# Patient Record
Sex: Female | Born: 1965 | Race: White | Hispanic: No | Marital: Married | Smoking: Never smoker
Health system: Southern US, Community
[De-identification: ages and names within clinical notes are randomized; demographics above are authoritative.]

## PROBLEM LIST (undated history)

## (undated) DIAGNOSIS — T8859XA Other complications of anesthesia, initial encounter: Secondary | ICD-10-CM

## (undated) DIAGNOSIS — K219 Gastro-esophageal reflux disease without esophagitis: Secondary | ICD-10-CM

## (undated) DIAGNOSIS — T7840XA Allergy, unspecified, initial encounter: Secondary | ICD-10-CM

## (undated) DIAGNOSIS — R011 Cardiac murmur, unspecified: Secondary | ICD-10-CM

## (undated) DIAGNOSIS — R7303 Prediabetes: Secondary | ICD-10-CM

## (undated) HISTORY — PX: TONSILLECTOMY: SUR1361

## (undated) HISTORY — DX: Cardiac murmur, unspecified: R01.1

## (undated) HISTORY — DX: Allergy, unspecified, initial encounter: T78.40XA

## (undated) HISTORY — DX: Prediabetes: R73.03

## (undated) HISTORY — DX: Gastro-esophageal reflux disease without esophagitis: K21.9

---

## 2012-12-02 HISTORY — PX: LEEP: SHX91

## 2015-03-22 ENCOUNTER — Other Ambulatory Visit (HOSPITAL_COMMUNITY)
Admission: RE | Admit: 2015-03-22 | Discharge: 2015-03-22 | Disposition: A | Discharge status: Home / Self Care | Payer: 59 | Source: Ambulatory Visit | Attending: Family Medicine | Admitting: Family Medicine

## 2015-03-22 DIAGNOSIS — Z124 Encounter for screening for malignant neoplasm of cervix: Secondary | ICD-10-CM | POA: Insufficient documentation

## 2015-08-22 ENCOUNTER — Other Ambulatory Visit (HOSPITAL_COMMUNITY): Payer: Self-pay | Admitting: Obstetrics & Gynecology

## 2015-08-22 DIAGNOSIS — Z1231 Encounter for screening mammogram for malignant neoplasm of breast: Secondary | ICD-10-CM

## 2015-08-24 ENCOUNTER — Ambulatory Visit (HOSPITAL_COMMUNITY)
Admission: RE | Admit: 2015-08-24 | Discharge: 2015-08-24 | Disposition: A | Discharge status: Home / Self Care | Payer: 59 | Source: Ambulatory Visit | Attending: Obstetrics & Gynecology | Admitting: Obstetrics & Gynecology

## 2015-08-24 DIAGNOSIS — Z1231 Encounter for screening mammogram for malignant neoplasm of breast: Secondary | ICD-10-CM | POA: Diagnosis not present

## 2016-05-30 ENCOUNTER — Other Ambulatory Visit (HOSPITAL_COMMUNITY)
Admission: RE | Admit: 2016-05-30 | Discharge: 2016-05-30 | Disposition: A | Discharge status: Home / Self Care | Payer: 59 | Source: Ambulatory Visit | Attending: Obstetrics & Gynecology | Admitting: Obstetrics & Gynecology

## 2016-05-30 ENCOUNTER — Other Ambulatory Visit: Payer: Self-pay | Admitting: Obstetrics & Gynecology

## 2016-05-30 DIAGNOSIS — Z1151 Encounter for screening for human papillomavirus (HPV): Secondary | ICD-10-CM | POA: Insufficient documentation

## 2016-05-30 DIAGNOSIS — Z01411 Encounter for gynecological examination (general) (routine) with abnormal findings: Secondary | ICD-10-CM | POA: Insufficient documentation

## 2016-06-03 LAB — CYTOLOGY - PAP

## 2017-02-18 ENCOUNTER — Other Ambulatory Visit: Payer: Self-pay | Admitting: Obstetrics & Gynecology

## 2017-02-18 DIAGNOSIS — Z1231 Encounter for screening mammogram for malignant neoplasm of breast: Secondary | ICD-10-CM

## 2017-03-10 ENCOUNTER — Ambulatory Visit: Payer: 59

## 2017-03-17 ENCOUNTER — Ambulatory Visit
Admission: RE | Admit: 2017-03-17 | Discharge: 2017-03-17 | Disposition: A | Discharge status: Home / Self Care | Payer: 59 | Source: Ambulatory Visit | Attending: Obstetrics & Gynecology | Admitting: Obstetrics & Gynecology

## 2017-03-17 DIAGNOSIS — Z1231 Encounter for screening mammogram for malignant neoplasm of breast: Secondary | ICD-10-CM

## 2017-07-02 ENCOUNTER — Other Ambulatory Visit: Payer: Self-pay | Admitting: Obstetrics & Gynecology

## 2018-01-09 HISTORY — PX: COLONOSCOPY: SHX174

## 2018-05-28 ENCOUNTER — Other Ambulatory Visit: Payer: Self-pay | Admitting: Obstetrics & Gynecology

## 2018-05-28 DIAGNOSIS — Z1231 Encounter for screening mammogram for malignant neoplasm of breast: Secondary | ICD-10-CM

## 2018-07-08 ENCOUNTER — Ambulatory Visit
Admission: RE | Admit: 2018-07-08 | Discharge: 2018-07-08 | Disposition: A | Discharge status: Home / Self Care | Payer: 59 | Source: Ambulatory Visit | Attending: Obstetrics & Gynecology | Admitting: Obstetrics & Gynecology

## 2018-07-08 ENCOUNTER — Other Ambulatory Visit: Payer: Self-pay | Admitting: Obstetrics & Gynecology

## 2018-07-08 DIAGNOSIS — Z1231 Encounter for screening mammogram for malignant neoplasm of breast: Secondary | ICD-10-CM

## 2019-08-17 ENCOUNTER — Other Ambulatory Visit: Payer: Self-pay | Admitting: Obstetrics & Gynecology

## 2019-08-17 DIAGNOSIS — Z1231 Encounter for screening mammogram for malignant neoplasm of breast: Secondary | ICD-10-CM

## 2019-10-06 ENCOUNTER — Ambulatory Visit
Admission: RE | Admit: 2019-10-06 | Discharge: 2019-10-06 | Disposition: A | Discharge status: Home / Self Care | Payer: 59 | Source: Ambulatory Visit | Attending: Obstetrics & Gynecology | Admitting: Obstetrics & Gynecology

## 2019-10-06 ENCOUNTER — Other Ambulatory Visit: Payer: Self-pay

## 2019-10-06 DIAGNOSIS — Z1231 Encounter for screening mammogram for malignant neoplasm of breast: Secondary | ICD-10-CM

## 2020-08-28 ENCOUNTER — Other Ambulatory Visit: Payer: Self-pay | Admitting: Obstetrics & Gynecology

## 2020-08-28 DIAGNOSIS — Z1231 Encounter for screening mammogram for malignant neoplasm of breast: Secondary | ICD-10-CM

## 2020-10-12 ENCOUNTER — Ambulatory Visit
Admission: RE | Admit: 2020-10-12 | Discharge: 2020-10-12 | Disposition: A | Discharge status: Home / Self Care | Payer: 59 | Source: Ambulatory Visit | Attending: Obstetrics & Gynecology | Admitting: Obstetrics & Gynecology

## 2020-10-12 ENCOUNTER — Other Ambulatory Visit: Payer: Self-pay

## 2020-10-12 DIAGNOSIS — Z1231 Encounter for screening mammogram for malignant neoplasm of breast: Secondary | ICD-10-CM

## 2020-10-17 ENCOUNTER — Other Ambulatory Visit: Payer: Self-pay | Admitting: Obstetrics & Gynecology

## 2020-10-17 DIAGNOSIS — R928 Other abnormal and inconclusive findings on diagnostic imaging of breast: Secondary | ICD-10-CM

## 2020-10-30 ENCOUNTER — Other Ambulatory Visit: Payer: 59

## 2020-11-08 ENCOUNTER — Other Ambulatory Visit: Payer: 59

## 2020-11-30 ENCOUNTER — Ambulatory Visit
Admission: RE | Admit: 2020-11-30 | Discharge: 2020-11-30 | Disposition: A | Discharge status: Home / Self Care | Payer: 59 | Source: Ambulatory Visit | Attending: Obstetrics & Gynecology | Admitting: Obstetrics & Gynecology

## 2020-11-30 ENCOUNTER — Other Ambulatory Visit: Payer: Self-pay

## 2020-11-30 ENCOUNTER — Ambulatory Visit: Payer: 59

## 2020-11-30 DIAGNOSIS — R928 Other abnormal and inconclusive findings on diagnostic imaging of breast: Secondary | ICD-10-CM

## 2021-04-04 ENCOUNTER — Other Ambulatory Visit (HOSPITAL_COMMUNITY): Payer: Self-pay | Admitting: Family Medicine

## 2021-04-16 ENCOUNTER — Other Ambulatory Visit (HOSPITAL_COMMUNITY): Payer: Self-pay | Admitting: Family Medicine

## 2021-11-20 ENCOUNTER — Other Ambulatory Visit: Payer: Self-pay | Admitting: Obstetrics and Gynecology

## 2021-11-20 DIAGNOSIS — Z1231 Encounter for screening mammogram for malignant neoplasm of breast: Secondary | ICD-10-CM

## 2021-12-20 ENCOUNTER — Ambulatory Visit
Admission: RE | Admit: 2021-12-20 | Discharge: 2021-12-20 | Disposition: A | Discharge status: Home / Self Care | Payer: 59 | Source: Ambulatory Visit | Attending: Obstetrics and Gynecology | Admitting: Obstetrics and Gynecology

## 2021-12-20 DIAGNOSIS — Z1231 Encounter for screening mammogram for malignant neoplasm of breast: Secondary | ICD-10-CM

## 2022-03-14 ENCOUNTER — Other Ambulatory Visit (HOSPITAL_COMMUNITY): Payer: Self-pay | Admitting: Family Medicine

## 2022-03-14 DIAGNOSIS — E785 Hyperlipidemia, unspecified: Secondary | ICD-10-CM

## 2022-08-23 ENCOUNTER — Other Ambulatory Visit (HOSPITAL_COMMUNITY)
Admission: RE | Admit: 2022-08-23 | Discharge: 2022-08-23 | Disposition: A | Discharge status: Home / Self Care | Payer: 59 | Source: Ambulatory Visit | Attending: Obstetrics and Gynecology | Admitting: Obstetrics and Gynecology

## 2022-08-23 ENCOUNTER — Other Ambulatory Visit: Payer: Self-pay | Admitting: Obstetrics and Gynecology

## 2022-08-23 DIAGNOSIS — Z01419 Encounter for gynecological examination (general) (routine) without abnormal findings: Secondary | ICD-10-CM | POA: Insufficient documentation

## 2022-08-28 LAB — CYTOLOGY - PAP
Adequacy: ABSENT
Comment: NEGATIVE
Comment: NEGATIVE
Comment: NEGATIVE
HPV 16: NEGATIVE
HPV 18 / 45: NEGATIVE
High risk HPV: POSITIVE — AB

## 2022-12-16 ENCOUNTER — Encounter: Payer: Self-pay | Admitting: Family Medicine

## 2022-12-16 ENCOUNTER — Ambulatory Visit: Payer: 59 | Admitting: Family Medicine

## 2022-12-16 VITALS — BP 130/84 | HR 81 | Temp 98.0°F | Ht 69.0 in | Wt 191.5 lb

## 2022-12-16 DIAGNOSIS — Z0000 Encounter for general adult medical examination without abnormal findings: Secondary | ICD-10-CM

## 2022-12-16 DIAGNOSIS — Z1159 Encounter for screening for other viral diseases: Secondary | ICD-10-CM | POA: Diagnosis not present

## 2022-12-16 NOTE — Patient Instructions (Addendum)
Welcome to [employer]! It was a pleasure meeting you today.  As discussed, Please schedule a 3 month follow up visit today.  Consider shingrix  I couldn't find dates in Tdap in [place] gyn notes nor [facility].    PLEASE NOTE:  If you had any LAB tests please let us know if you have not heard back within a few days. You may see your results on MyChart before we have a chance to review them but we will give you a call once they are reviewed by [place]. If we ordered any REFERRALS today, please let us know if you have not heard from their office within the next week.  Let us know through MyChart if you are needing REFILLS, or have your pharmacy send [place] the request. You can also use MyChart to communicate with me or any office staff.  Please try these tips to maintain a healthy lifestyle:  Eat most of your calories during the day when you are active. Eliminate processed foods including packaged sweets (pies, cakes, cookies), reduce intake of potatoes, white bread, white pasta, and white rice. Look for whole grain options, oat flour or almond flour.  Each meal should contain half fruits/vegetables, one quarter protein, and one quarter carbs (no bigger than a computer mouse).  Cut down on sweet beverages. This includes juice, soda, and sweet tea. Also watch fruit intake, though this is a healthier sweet option, it still contains natural sugar! Limit to 3 servings daily.  Drink at least 1 glass of water with each meal and aim for at least 8 glasses per day  Exercise at least 150 minutes every week.

## 2022-12-16 NOTE — Progress Notes (Signed)
Phone [phone]   Subjective:   Patient is a 57 y.o. female presenting for annual physical.    Chief Complaint  Patient presents with   Establish Care    Need new pcp CPE  New pt-annual   Elevated cholesterol. Stress eats. Some exercise  from MD orig 8 yrs ago.  Occ GERD-protonix occ.  Had a lot of stress and no help.  Mom deceased and Dad ill so not taking care of self.  Changed jobs-better.  Pap-Dr. Louellen Molder on new provider.  [facility] in [place] in March(Dr. Nelda Marseille).  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  HPV.  Tdap 5 yrs ago at [facility].    See problem oriented charting- ROS- ROS: Gen: no fever, chills  Skin: no rash, itching ENT: no ear pain, ear drainage, nasal congestion, rhinorrhea, sinus pressure, sore throat.  Allergies-intermitt Eyes: no blurry vision, double vision.  Chronic dry eye R.- sustane eye drops   occ pataday when allergies flare.   Resp: no cough, wheeze,SOB CV: no CP, palpitations, LE edema,  heart murmur-MVP GI: no heartburn, n/v/d/c, abd pain cscope 5 yrs ago 11/[facility]-not want want to go back.  Occ loose stools.  GU: no dysuria, urgency, frequency, hematuria MSK: no joint pain, myalgias, back pain.  L knee injury >66yr ago so limits some activity.  Neuro: no dizziness, headache, weakness, vertigo Psych: no depression, anxiety, insomnia, SI -comes and goes.  Burnout last yr.   The following were reviewed and entered/updated in epic: Past Medical History:  Diagnosis Date   Allergy    Egg, wheat, other food allergies   GERD (gastroesophageal reflux disease)    Heart murmur    MVP   There are no problems to display for this patient.  Past Surgical History:  Procedure Laterality Date   LEEP  2014   TONSILLECTOMY      Family History  Problem Relation Age of Onset   Breast cancer Neg Hx     Medications- reviewed and updated Current Outpatient Medications  Medication Sig Dispense Refill   pantoprazole  (PROTONIX) 40 MG tablet Take 40 mg by mouth as needed.     Probiotic Product (PROBIOTIC PO) Take by mouth as needed.     No current facility-administered medications for this visit.    Allergies-reviewed and updated Allergies  Allergen Reactions   Egg Shells     Other Reaction(s): Other  Sore Throat and Congestion   Molds & Smuts     Other Reaction(s): Other  Sore throat and Congestion   Other Other (See Comments)    TOMATOES    Reaction: Sore Throat and Congestion   Wheat Bran     Other Reaction(s): Other  Sore Throat and Congestion    Social History   Social History Narrative   Not on file   Objective  Objective:  BP 130/84   Pulse 81   Temp 98 F (36.7 C) (Temporal)   Ht 5\' 9"  (1.753 m)   Wt 191 lb 8 oz (86.9 kg)   SpO2 99%   BMI 28.28 kg/m  Physical Exam  Gen: WDWN NAD wf HEENT: NCAT, conjunctiva not injected, sclera nonicteric TM WNL B, OP moist, no exudates  NECK:  supple, no thyromegaly, no nodes, no carotid bruits CARDIAC: RRR, S1S2+, no murmur.  + MSC DP 2+B LUNGS: CTAB. No wheezes ABDOMEN:  BS+, soft, NTND, No HSM, no masses EXT:  no edema MSK: no gross abnormalities. MS 5/5 all 4 NEURO:  A&O x3.  CN II-XII intact.  PSYCH: normal mood. Good eye contact    Assessment and Plan   Health Maintenance counseling: 1. Anticipatory guidance: Patient counseled regarding regular dental exams q6 months, eye exams,  avoiding smoking and second hand smoke, limiting alcohol to 1 beverage per day, no illicit drugs.   2. Risk factor reduction:  Advised patient of need for regular exercise and diet rich and fruits and vegetables to reduce risk of heart attack and stroke. Exercise- increase.  Wt Readings from Last 3 Encounters:  12/16/22 191 lb 8 oz (86.9 kg)   3. Immunizations/screenings/ancillary studies Immunization History  Administered Date(s) Administered   COVID-19, mRNA, vaccine(Comirnaty)12 years and older 11/17/2022   Influenza Inj Mdck Quad Pf  08/22/2017, 08/26/2019   Influenza-Unspecified 11/17/2022   PFIZER(Purple Top)SARS-COV-2 Vaccination 02/05/2020, 03/04/2020, 10/19/2020   Pfizer Covid-19 Vaccine Bivalent Booster 37yrs & up 08/27/2021   Tdap 12/02/2008   Health Maintenance Due  Topic Date Due   Hepatitis C Screening  Never done   Zoster Vaccines- Shingrix (1 of 2) Never done   DTaP/Tdap/Td (2 - Td or Tdap) 12/02/2018    4. Cervical cancer screening- getting at gyn 5. Breast cancer screening-  mammogram gyn 6. Colon cancer screening - get records 7. Skin cancer screening- advised regular sunscreen use. Denies worrisome, changing, or new skin lesions.  8. Birth control/STD check- menopause 9. Osteoporosis screening- n/a 10. Smoking associated screening - non smoker  Problem List Items Addressed This Visit   None Visit Diagnoses     Wellness examination    -  Primary   Relevant Orders   Comprehensive metabolic panel   Hemoglobin A1c   Hepatitis C antibody   TSH   Lipid panel   CBC with Differential/Platelet   Encounter for hepatitis C screening test for low risk patient       Relevant Orders   Hepatitis C antibody     1  Wellness-anticipatory guidance.  Work on [employer].  Check CBC,CMP,lipids,TSH, A1C.  F/u 61m  discussed Shingrix.  Reviewed records after pt left for Tdap-can't find in gyn nor [facility] records.   Recommended follow up: 3 mo Return in about 3 months (around 03/17/2023) for me f/u HLD.   sch labs 1 wk prior. Future Appointments  Date Time Provider [place]  03/18/2023  8:30 AM [facility]  03/24/2023  3:30 PM Tawnya Crook, MD [facility]     Lab/Order associations:return fasting   ICD-10-CM   1. Wellness examination  Z00.00 Comprehensive metabolic panel    Hemoglobin A1c    Hepatitis C antibody    TSH    Lipid panel    CBC with Differential/Platelet    2. Encounter for hepatitis C screening test for low risk patient  Z11.59 Hepatitis C antibody      No orders of  the defined types were placed in this encounter.   Wellington Hampshire, MD

## 2023-01-06 ENCOUNTER — Other Ambulatory Visit: Payer: Self-pay | Admitting: Family Medicine

## 2023-01-06 DIAGNOSIS — Z1231 Encounter for screening mammogram for malignant neoplasm of breast: Secondary | ICD-10-CM

## 2023-02-25 ENCOUNTER — Ambulatory Visit
Admission: RE | Admit: 2023-02-25 | Discharge: 2023-02-25 | Disposition: A | Discharge status: Home / Self Care | Payer: 59 | Source: Ambulatory Visit | Attending: Family Medicine | Admitting: Family Medicine

## 2023-02-25 DIAGNOSIS — Z1231 Encounter for screening mammogram for malignant neoplasm of breast: Secondary | ICD-10-CM

## 2023-02-26 ENCOUNTER — Encounter: Payer: Self-pay | Admitting: Obstetrics & Gynecology

## 2023-02-26 ENCOUNTER — Other Ambulatory Visit (HOSPITAL_COMMUNITY)
Admission: RE | Admit: 2023-02-26 | Discharge: 2023-02-26 | Disposition: A | Discharge status: Home / Self Care | Payer: 59 | Source: Ambulatory Visit | Attending: Obstetrics & Gynecology | Admitting: Obstetrics & Gynecology

## 2023-02-26 ENCOUNTER — Ambulatory Visit (INDEPENDENT_AMBULATORY_CARE_PROVIDER_SITE_OTHER): Payer: 59 | Admitting: Obstetrics & Gynecology

## 2023-02-26 VITALS — BP 136/88 | HR 84 | Ht 69.0 in | Wt 190.4 lb

## 2023-02-26 DIAGNOSIS — N952 Postmenopausal atrophic vaginitis: Secondary | ICD-10-CM

## 2023-02-26 DIAGNOSIS — Z87410 Personal history of cervical dysplasia: Secondary | ICD-10-CM | POA: Diagnosis not present

## 2023-02-26 DIAGNOSIS — Z124 Encounter for screening for malignant neoplasm of cervix: Secondary | ICD-10-CM

## 2023-02-26 MED ORDER — ESTRADIOL 0.1 MG/GM VA CREA: TOPICAL_CREAM | VAGINAL | 6 refills | Status: DC

## 2023-02-26 NOTE — Progress Notes (Signed)
   GYN VISIT Patient name: Linda Orr MRN [id number]  Date of birth: 1966/02/24 Chief Complaint:   Gynecologic Exam  History of Present Illness:   Linda Orr is a 57 y.o. PM female being seen today for 2nd opinion/follow up regarding:  -Cervical dysplasia Long standing h/o dysplasia: LEEP 2014 Yearly pap with LSIL or ASCUS, HPV pos Colpo 2019, 2021- all low grade CIN 1 Due to cervix being flush to vaginal mucosa, pt has not been a candidate for repeat LEEP     [facility] patient-records reviewed as above and in [facility] tab 11/20/22 (office notes)  Of note, low grade dysplasia has been present since LEEP in 2014  No LMP recorded. Patient is postmenopausal.     02/26/2023    8:39 AM 12/16/2022    3:27 PM  Depression screen PHQ 2/9  Decreased Interest 0 1  Down, Depressed, Hopeless 1 0  PHQ - 2 Score 1 1  Altered sleeping 1 0  Tired, decreased energy 1 1  Change in appetite 0 1  Feeling bad or failure about yourself  0 0  Trouble concentrating 0 0  Moving slowly or fidgety/restless 0 0  Suicidal thoughts 0 0  PHQ-9 Score 3 3  Difficult doing work/chores  Not difficult at all     Review of Systems:   Pertinent items are noted in HPI Denies fever/chills, dizziness, headaches, visual disturbances, fatigue, shortness of breath, chest pain, abdominal pain, vomiting, no problems with bowel movements, urination, or intercourse unless otherwise stated above.  Pertinent History Reviewed:  Reviewed past medical,surgical, social, obstetrical and family history.  Reviewed problem list, medications and allergies. Physical Assessment:   Vitals:   02/26/23 0830  BP: 136/88  Pulse: 84  Weight: 190 lb 6.4 oz (86.4 kg)  Height: 5\' 9"  (1.753 m)  Body mass index is 28.12 kg/m.       Physical Examination:   General appearance: alert, well appearing, and in no distress  Psych: mood appropriate, normal affect  Skin: warm & dry   Cardiovascular: normal heart rate  noted  Respiratory: normal respiratory effort, no distress  Abdomen: soft, non-tender   Pelvic: VULVA: normal appearing vulva with no masses, tenderness or lesions, VAGINA: flat pink mucosa, no discharge, no lesions, CERVIX: flush with vagina, no discrete lesion noted, no bleeding appreciated.  On bimanual exam- atrophic/cervical stenosis palpated.  UTERUS: uterus is normal size, shape, consistency and nontender  Extremities: no edema, no calf tenderness bilaterally  Chaperone:  pt declined     Assessment & Plan:  1) Cervical dysplasia -discussed that ASCCP guidelines are not clear on prolong low grade dysplasia -discussed management options ranging from continue observation, proceeding with laser ablation and/or consideration for hysterectomy -based on exam would also recommend estrace cream as a potential treatment option -questions/concerns were addressed, plan for continued conservative management -pt aware though unlikely due to the dysplasia there is the underlying concern of undiagnosed cervical cancer.     Meds ordered this encounter  Medications   estradiol (ESTRACE VAGINAL) 0.1 MG/GM vaginal cream    Sig: Apply pea-sized amount (first line of applicator) at night once per week    Dispense:  42.5 g    Refill:  6      Return in about 1 year (around 02/26/2024) for Annual.   Janyth Pupa, DO Attending [place], [place] for [place], [place]

## 2023-02-27 ENCOUNTER — Other Ambulatory Visit: Payer: Self-pay | Admitting: Family Medicine

## 2023-02-27 DIAGNOSIS — R928 Other abnormal and inconclusive findings on diagnostic imaging of breast: Secondary | ICD-10-CM

## 2023-03-04 LAB — CYTOLOGY - PAP
Adequacy: ABSENT
Comment: NEGATIVE
Comment: NEGATIVE
Comment: NEGATIVE
HPV 16: NEGATIVE
HPV 18 / 45: NEGATIVE
High risk HPV: POSITIVE — AB

## 2023-03-18 ENCOUNTER — Other Ambulatory Visit (INDEPENDENT_AMBULATORY_CARE_PROVIDER_SITE_OTHER): Payer: 59

## 2023-03-18 DIAGNOSIS — Z0000 Encounter for general adult medical examination without abnormal findings: Secondary | ICD-10-CM | POA: Diagnosis not present

## 2023-03-18 DIAGNOSIS — Z1159 Encounter for screening for other viral diseases: Secondary | ICD-10-CM

## 2023-03-18 LAB — CBC WITH DIFFERENTIAL/PLATELET
Basophils Absolute: 0 10*3/uL (ref 0.0–0.1)
Basophils Relative: 0.6 % (ref 0.0–3.0)
Eosinophils Absolute: 0 10*3/uL (ref 0.0–0.7)
Eosinophils Relative: 0.7 % (ref 0.0–5.0)
HCT: 43.2 % (ref 36.0–46.0)
Hemoglobin: 14.2 g/dL (ref 12.0–15.0)
Lymphocytes Relative: 31.9 % (ref 12.0–46.0)
Lymphs Abs: 2.1 10*3/uL (ref 0.7–4.0)
MCHC: 32.8 g/dL (ref 30.0–36.0)
MCV: 80.4 fl (ref 78.0–100.0)
Monocytes Absolute: 0.5 10*3/uL (ref 0.1–1.0)
Monocytes Relative: 6.9 % (ref 3.0–12.0)
Neutro Abs: 4 10*3/uL (ref 1.4–7.7)
Neutrophils Relative %: 59.9 % (ref 43.0–77.0)
Platelets: 394 10*3/uL (ref 150.0–400.0)
RBC: 5.38 Mil/uL — ABNORMAL HIGH (ref 3.87–5.11)
RDW: 13.5 % (ref 11.5–15.5)
WBC: 6.6 10*3/uL (ref 4.0–10.5)

## 2023-03-18 LAB — LIPID PANEL
Cholesterol: 231 mg/dL — ABNORMAL HIGH (ref 0–200)
HDL: 55.5 mg/dL (ref >39.00)
LDL Cholesterol: 154 mg/dL — ABNORMAL HIGH (ref 0–99)
NonHDL: 175.3
Total CHOL/HDL Ratio: 4
Triglycerides: 106 mg/dL (ref 0.0–149.0)
VLDL: 21.2 mg/dL (ref 0.0–40.0)

## 2023-03-18 LAB — COMPREHENSIVE METABOLIC PANEL
ALT: 18 U/L (ref 0–35)
AST: 17 U/L (ref 0–37)
Albumin: 4.2 g/dL (ref 3.5–5.2)
Alkaline Phosphatase: 37 U/L — ABNORMAL LOW (ref 39–117)
BUN: 10 mg/dL (ref 6–23)
CO2: 25 mEq/L (ref 19–32)
Calcium: 9 mg/dL (ref 8.4–10.5)
Chloride: 106 mEq/L (ref 96–112)
Creatinine, Ser: 0.73 mg/dL (ref 0.40–1.20)
GFR: 91.61 mL/min (ref >60.00)
Glucose, Bld: 108 mg/dL — ABNORMAL HIGH (ref 70–99)
Potassium: 4.3 mEq/L (ref 3.5–5.1)
Sodium: 140 mEq/L (ref 135–145)
Total Bilirubin: 0.5 mg/dL (ref 0.2–1.2)
Total Protein: 6.5 g/dL (ref 6.0–8.3)

## 2023-03-18 LAB — TSH: TSH: 3.04 u[IU]/mL (ref 0.35–5.50)

## 2023-03-18 LAB — HEMOGLOBIN A1C: Hgb A1c MFr Bld: 6.1 % (ref 4.6–6.5)

## 2023-03-19 LAB — HEPATITIS C ANTIBODY: Hepatitis C Ab: NONREACTIVE

## 2023-03-21 ENCOUNTER — Ambulatory Visit
Admission: RE | Admit: 2023-03-21 | Discharge: 2023-03-21 | Disposition: A | Discharge status: Home / Self Care | Payer: 59 | Source: Ambulatory Visit | Attending: Family Medicine | Admitting: Family Medicine

## 2023-03-21 ENCOUNTER — Other Ambulatory Visit: Payer: Self-pay | Admitting: Family Medicine

## 2023-03-21 DIAGNOSIS — N6489 Other specified disorders of breast: Secondary | ICD-10-CM

## 2023-03-21 DIAGNOSIS — R928 Other abnormal and inconclusive findings on diagnostic imaging of breast: Secondary | ICD-10-CM

## 2023-03-24 ENCOUNTER — Ambulatory Visit (INDEPENDENT_AMBULATORY_CARE_PROVIDER_SITE_OTHER): Payer: 59 | Admitting: Family Medicine

## 2023-03-24 ENCOUNTER — Encounter: Payer: Self-pay | Admitting: Family Medicine

## 2023-03-24 VITALS — BP 118/70 | HR 73 | Temp 97.7°F | Ht 69.0 in | Wt 190.4 lb

## 2023-03-24 DIAGNOSIS — R7303 Prediabetes: Secondary | ICD-10-CM

## 2023-03-24 DIAGNOSIS — E7800 Pure hypercholesterolemia, unspecified: Secondary | ICD-10-CM | POA: Insufficient documentation

## 2023-03-24 DIAGNOSIS — K635 Polyp of colon: Secondary | ICD-10-CM | POA: Diagnosis not present

## 2023-03-24 DIAGNOSIS — R928 Other abnormal and inconclusive findings on diagnostic imaging of breast: Secondary | ICD-10-CM

## 2023-03-24 NOTE — Progress Notes (Addendum)
Subjective:     Patient ID: Linda Orr, female    DOB: [birth year], 57 y.o.   MRN: [id number]  Chief Complaint  Patient presents with   Medical Management of Chronic Issues    3 month follow-up on HLD Discuss recent lab results     HPI 1  HLD-no FH CAD/CVA.  Usu LDL usu 150-170.  FH HLD-medications not help much in family 2.  PreDM-A1C 6.1 on 4/16.  Eating 1200-1400 cal/day.  Not exercising.  3.  GERD-occasional protonix 40 mg.  4.  Abnormal mamm-repeat not saw abnormal on repeat and schedule for biopsy.  Patient changed mind and wants to repeat in 6 months. No FH breast cancer.   Health Maintenance Due  Topic Date Due   DTaP/Tdap/Td (2 - Td or Tdap) 12/02/2018   COLONOSCOPY (Pts 45-37yrs Insurance coverage will need to be confirmed)  01/09/2023    Past Medical History:  Diagnosis Date   Allergy    Egg, wheat, other food allergies   GERD (gastroesophageal reflux disease)    Heart murmur    MVP    Past Surgical History:  Procedure Laterality Date   LEEP  2014   TONSILLECTOMY       Current Outpatient Medications:    pantoprazole (PROTONIX) 40 MG tablet, Take 40 mg by mouth as needed., Disp: , Rfl:    Probiotic Product (PROBIOTIC PO), Take by mouth as needed., Disp: , Rfl:    estradiol (ESTRACE VAGINAL) 0.1 MG/GM vaginal cream, Apply pea-sized amount (first line of applicator) at night once per week (Patient not taking: Reported on 03/24/2023), Disp: 42.5 g, Rfl: 6  Allergies  Allergen Reactions   Egg Shells     Other Reaction(s): Other  Sore Throat and Congestion   Molds & Smuts     Other Reaction(s): Other  Sore throat and Congestion   Other Other (See Comments)    TOMATOES    Reaction: Sore Throat and Congestion   Wheat     Other Reaction(s): Other  Sore Throat and Congestion   ROS neg/noncontributory except as noted HPI/below      Objective:     BP 118/70   Pulse 73   Temp 97.7 F (36.5 C) (Temporal)   Ht  (1.753 m)   Wt 190 lb 6  oz (86.4 kg)   SpO2 99%   BMI 28.11 kg/m  Wt Readings from Last 3 Encounters:  03/24/23 190 lb 6 oz (86.4 kg)  02/26/23 190 lb 6.4 oz (86.4 kg)  12/16/22 191 lb 8 oz (86.9 kg)    Physical Exam   Gen: WDWN NAD HEENT: NCAT, conjunctiva not injected, sclera nonicteric MSK: no gross abnormalities.  NEURO: A&O x3.  CN II-XII intact.  PSYCH: normal mood. Good eye contact  Discussed labs, mamm, plan, etc-87min     Assessment & Plan:  Prediabetes  Pure hypercholesterolemia  Polyp of colon, unspecified part of colon, unspecified type -     Ambulatory referral to Gastroenterology  Abnormal mammogram of left breast  1.  Prediabetes-new diagnosis.  Discussed risk of progression to diabetes.  Discussed diet/exercise/meds.  For now, patient would like to work harder on diet/exercise.  Follow-up in 6 months 2.  Hyperlipidemia-chronic.  Not controlled.  However, low risk.  No family history CAD/CVA.  Patient would like to continue working on diet/exercise. 3.  Abnormal mammogram of left breast-since nothing was seen on the diagnostic/ultrasound, patient does not want to do a blind biopsy.  She would rather  repeat the mammogram in 6 months.  This is very reasonable.  Also, she does have very dense breasts.  I will follow-up with radiology to see if MRI of the breast is an option. 4.  Colon polyp-reviewed records.  Patient is due for repeat colonoscopy.  Referral made to GI.  Angelena Sole, MD

## 2023-03-24 NOTE — Patient Instructions (Addendum)
[place] for [employer] on nutritionist  Work on exercise  Check on Tetanus, Diphtheria, and Pertussis (Tdap).

## 2023-03-27 ENCOUNTER — Telehealth: Payer: Self-pay | Admitting: Family Medicine

## 2023-03-27 NOTE — Telephone Encounter (Signed)
Please see message below

## 2023-03-27 NOTE — Telephone Encounter (Signed)
Patient returned call. Requests to be called (available until 2:30 pm today).

## 2023-06-18 LAB — HM MAMMOGRAPHY

## 2023-06-19 ENCOUNTER — Encounter: Payer: Self-pay | Admitting: Family Medicine

## 2023-09-19 ENCOUNTER — Telehealth: Payer: Self-pay | Admitting: Family Medicine

## 2023-09-19 NOTE — Telephone Encounter (Signed)
Patient has 6 month f/u with PCP on 11/6 and wants to know if labs can be ordered so she can complete them prior to OV @ 4pm. Please Advise.

## 2023-09-23 ENCOUNTER — Ambulatory Visit: Payer: 59 | Admitting: Family Medicine

## 2023-10-08 ENCOUNTER — Other Ambulatory Visit: Payer: Self-pay | Admitting: Family Medicine

## 2023-10-08 ENCOUNTER — Ambulatory Visit: Payer: 59 | Admitting: Family Medicine

## 2023-10-08 ENCOUNTER — Encounter: Payer: Self-pay | Admitting: Family Medicine

## 2023-10-08 VITALS — BP 118/84 | HR 76 | Temp 98.3°F | Resp 18 | Ht 69.0 in | Wt 185.1 lb

## 2023-10-08 DIAGNOSIS — M542 Cervicalgia: Secondary | ICD-10-CM

## 2023-10-08 DIAGNOSIS — M545 Low back pain, unspecified: Secondary | ICD-10-CM

## 2023-10-08 DIAGNOSIS — E7800 Pure hypercholesterolemia, unspecified: Secondary | ICD-10-CM | POA: Diagnosis not present

## 2023-10-08 DIAGNOSIS — K219 Gastro-esophageal reflux disease without esophagitis: Secondary | ICD-10-CM | POA: Diagnosis not present

## 2023-10-08 DIAGNOSIS — R7303 Prediabetes: Secondary | ICD-10-CM

## 2023-10-08 DIAGNOSIS — G8929 Other chronic pain: Secondary | ICD-10-CM

## 2023-10-08 MED ORDER — PANTOPRAZOLE SODIUM 40 MG PO TBEC: 40.0000 mg | DELAYED_RELEASE_TABLET | ORAL | 1 refills | Status: DC | PRN

## 2023-10-08 NOTE — Progress Notes (Signed)
Subjective:     Patient ID: Linda Orr, female    DOB: 08-Apr-1966, 57 y.o.   MRN: [id number]  Chief Complaint  Patient presents with   Medical Management of Chronic Issues    6 month follow-up on predm, cholesterol   HPI Pre DM - A1c 6.1 on 4/16 (over 6 months). States she is working on her diet. 5lb weight loss. Reports she has slowly increased exercising, walking and riding her bike. Stress at work sometimes interferes with her diet. Often fatigued.   HLD- Not on medications, states they did not help much.   GERD- Occasional protonix 40 mg PRN, not taking everyday. Is taking maybe every other week.   Back and Hip Pain - Feels like her neck is "locked" up. Hip and lower back pain has gotten worse. Seeing message therapist. Thinks sitting and standing freely at work with an adjustable desk would provide the best relief.   Health Maintenance Due  Topic Date Due   DTaP/Tdap/Td (2 - Td or Tdap) 12/02/2018    Past Medical History:  Diagnosis Date   Allergy    Egg, wheat, other food allergies   GERD (gastroesophageal reflux disease)    Heart murmur    MVP   Prediabetes     Past Surgical History:  Procedure Laterality Date   LEEP  2014   TONSILLECTOMY       Current Outpatient Medications:    Probiotic Product (PROBIOTIC PO), Take by mouth as needed., Disp: , Rfl:    estradiol (ESTRACE VAGINAL) 0.1 MG/GM vaginal cream, Apply pea-sized amount (first line of applicator) at night once per week (Patient not taking: Reported on 03/24/2023), Disp: 42.5 g, Rfl: 6   pantoprazole (PROTONIX) 40 MG tablet, Take 1 tablet (40 mg total) by mouth as needed., Disp: 90 tablet, Rfl: 1  Allergies  Allergen Reactions   Egg Shells     Other Reaction(s): Other  Sore Throat and Congestion   Molds & Smuts     Other Reaction(s): Other  Sore throat and Congestion   Other Other (See Comments)    TOMATOES    Reaction: Sore Throat and Congestion   Wheat     Other Reaction(s):  Other  Sore Throat and Congestion   ROS neg/noncontributory except as noted HPI/below      Objective:     BP 118/84   Pulse 76   Temp 98.3 F (36.8 C) (Temporal)   Resp 18   Ht 5\' 9"  (1.753 m)   Wt 185 lb 2 oz (84 kg)   SpO2 98%   BMI 27.34 kg/m  Wt Readings from Last 3 Encounters:  10/08/23 185 lb 2 oz (84 kg)  03/24/23 190 lb 6 oz (86.4 kg)  02/26/23 190 lb 6.4 oz (86.4 kg)   Physical Exam   Gen: WDWN NAD HEENT: NCAT, conjunctiva not injected, sclera nonicteric CARDIAC: RRR, S1S2+, no murmur. DP 2+B LUNGS: CTAB. No wheezes ABDOMEN:  BS+, soft, NTND, No HSM, no masses EXT:  no edema MSK: no gross abnormalities.  NEURO: A&O x3.  CN II-XII intact.  PSYCH: normal mood. Good eye contact     Assessment & Plan:  Prediabetes -     Comprehensive metabolic panel -     Hemoglobin A1c -     Amb ref to Medical Nutrition Therapy-MNT  Pure hypercholesterolemia -     Amb ref to Medical Nutrition Therapy-MNT  Gastroesophageal reflux disease without esophagitis -     Pantoprazole Sodium; Take 1  tablet (40 mg total) by mouth as needed.  Dispense: 90 tablet; Refill: 1    Return in about 6 months (around 04/06/2024) for annual physical.  I,Anaiya N Rice,acting as a scribe for Angelena Sole, MD.,have documented all relevant documentation on the behalf of Angelena Sole, MD,as directed by  Angelena Sole, MD while in the presence of Angelena Sole, MD.  Juleen Starr, have reviewed all documentation for this visit. The documentation on 10/08/23 for the exam, diagnosis, procedures, and orders are all accurate and complete. ***  Anaiya N Rice

## 2023-10-08 NOTE — Patient Instructions (Signed)

## 2023-10-09 DIAGNOSIS — M542 Cervicalgia: Secondary | ICD-10-CM | POA: Insufficient documentation

## 2023-10-09 DIAGNOSIS — M545 Low back pain, unspecified: Secondary | ICD-10-CM | POA: Insufficient documentation

## 2023-10-09 LAB — COMPREHENSIVE METABOLIC PANEL
ALT: 17 U/L (ref 0–35)
AST: 18 U/L (ref 0–37)
Albumin: 4.4 g/dL (ref 3.5–5.2)
Alkaline Phosphatase: 37 U/L — ABNORMAL LOW (ref 39–117)
BUN: 11 mg/dL (ref 6–23)
CO2: 26 meq/L (ref 19–32)
Calcium: 9.5 mg/dL (ref 8.4–10.5)
Chloride: 103 meq/L (ref 96–112)
Creatinine, Ser: 0.92 mg/dL (ref 0.40–1.20)
GFR: 69.13 mL/min (ref >60.00)
Glucose, Bld: 85 mg/dL (ref 70–99)
Potassium: 4.2 meq/L (ref 3.5–5.1)
Sodium: 139 meq/L (ref 135–145)
Total Bilirubin: 0.4 mg/dL (ref 0.2–1.2)
Total Protein: 7.1 g/dL (ref 6.0–8.3)

## 2023-10-09 LAB — HEMOGLOBIN A1C: Hgb A1c MFr Bld: 6 % (ref 4.6–6.5)

## 2023-10-09 NOTE — Assessment & Plan Note (Signed)
Chronic.  Managing with massage therapy.  Letter written for her FSA.  Also, she is working on improving her workstation.  Needs a letter for an adjustable desk.

## 2023-10-09 NOTE — Assessment & Plan Note (Signed)
Chronic.  Intermittent.  Taking Protonix 40 mg daily as needed.  Renewed

## 2023-10-09 NOTE — Assessment & Plan Note (Signed)
Chronic.  Diet.  Will increase exercise when she can

## 2023-10-09 NOTE — Progress Notes (Signed)
Labs are stable.  A1C is same.  Keep working on diet/exercise

## 2023-10-09 NOTE — Assessment & Plan Note (Signed)
Chronic.  Working on diet.  Some exercise.  Check labs

## 2023-11-13 ENCOUNTER — Encounter: Payer: Self-pay | Admitting: Internal Medicine

## 2023-11-27 ENCOUNTER — Ambulatory Visit: Payer: 59 | Admitting: Family Medicine

## 2023-11-27 ENCOUNTER — Encounter: Payer: Self-pay | Admitting: Family Medicine

## 2023-11-27 VITALS — BP 110/60 | HR 84 | Temp 97.9°F | Resp 18 | Ht 69.0 in | Wt 188.0 lb

## 2023-11-27 DIAGNOSIS — R222 Localized swelling, mass and lump, trunk: Secondary | ICD-10-CM | POA: Diagnosis not present

## 2023-11-27 NOTE — Progress Notes (Signed)
   Subjective:     Patient ID: Linda Orr, female    DOB: [birth year], 57 y.o.   MRN: [id number]  Chief Complaint  Patient presents with   Bump on back    Bump on back, noticed three weeks ago, just want it checked, looks swollen    HPI Lump on back for 3 wks. Gets regular massages and therapist found lump.  No pain.  Soft.  Some swelling.  No drainage, etc.   Health Maintenance Due  Topic Date Due   Colonoscopy  01/09/2023    Past Medical History:  Diagnosis Date   Allergy    Egg, wheat, other food allergies   GERD (gastroesophageal reflux disease)    Heart murmur    MVP   Prediabetes     Past Surgical History:  Procedure Laterality Date   LEEP  2014   TONSILLECTOMY       Current Outpatient Medications:    estradiol (ESTRACE VAGINAL) 0.1 MG/GM vaginal cream, Apply pea-sized amount (first line of applicator) at night once per week, Disp: 42.5 g, Rfl: 6   pantoprazole (PROTONIX) 40 MG tablet, Take 1 tablet (40 mg total) by mouth daily as needed., Disp: 90 tablet, Rfl: 1   Probiotic Product (PROBIOTIC PO), Take by mouth as needed., Disp: , Rfl:   Allergies  Allergen Reactions   Egg Shells     Other Reaction(s): Other  Sore Throat and Congestion   Molds & Smuts     Other Reaction(s): Other  Sore throat and Congestion   Other Other (See Comments)    TOMATOES    Reaction: Sore Throat and Congestion   Wheat     Other Reaction(s): Other  Sore Throat and Congestion   ROS neg/noncontributory except as noted HPI/below      Objective:     BP 110/60   Pulse 84   Temp 97.9 F (36.6 C) (Temporal)   Resp 18   Ht 5\' 9"  (1.753 m)   Wt 188 lb (85.3 kg)   SpO2 97%   BMI 27.76 kg/m  Wt Readings from Last 3 Encounters:  11/27/23 188 lb (85.3 kg)  10/08/23 185 lb 2 oz (84 kg)  03/24/23 190 lb 6 oz (86.4 kg)    Physical Exam   Gen: WDWN NAD HEENT: NCAT, conjunctiva not injected, sclera nonicteric EXT:  no edema MSK: no gross abnormalities.  NEURO:  A&O x3.  CN II-XII intact.  PSYCH: normal mood. Good eye contact R upper back-above bra line-approx 5x6cm mobile, nt mass.  Firmer in center.  Nonfluctuant.      Assessment & Plan:  Lump of skin of back  Lump R upper back-new for 3 wks.  ?abscess(no pain/heat/redness so doubt), ?lipoma-but new, ?hematoma-no injury.  Other.  Will start w/u/s.  Monitor.  Worse, new symptoms, etc, let us know  Return if symptoms worsen or fail to improve.  Angelena Sole, MD

## 2023-11-27 NOTE — Patient Instructions (Signed)
It was very nice to see you today!  [phone] for mammogram, [phone] other studies    PLEASE NOTE:  If you had any lab tests please let us know if you have not heard back within a few days. You may see your results on MyChart before we have a chance to review them but we will give you a call once they are reviewed by [place]. If we ordered any referrals today, please let us know if you have not heard from their office within the next week.   Please try these tips to maintain a healthy lifestyle:  Eat most of your calories during the day when you are active. Eliminate processed foods including packaged sweets (pies, cakes, cookies), reduce intake of potatoes, white bread, white pasta, and white rice. Look for whole grain options, oat flour or almond flour.  Each meal should contain half fruits/vegetables, one quarter protein, and one quarter carbs (no bigger than a computer mouse).  Cut down on sweet beverages. This includes juice, soda, and sweet tea. Also watch fruit intake, though this is a healthier sweet option, it still contains natural sugar! Limit to 3 servings daily.  Drink at least 1 glass of water with each meal and aim for at least 8 glasses per day  Exercise at least 150 minutes every week.

## 2023-12-09 ENCOUNTER — Ambulatory Visit
Admission: RE | Admit: 2023-12-09 | Discharge: 2023-12-09 | Disposition: A | Discharge status: Home / Self Care | Payer: 59 | Source: Ambulatory Visit | Attending: Family Medicine | Admitting: Family Medicine

## 2023-12-09 DIAGNOSIS — R222 Localized swelling, mass and lump, trunk: Secondary | ICD-10-CM

## 2023-12-10 ENCOUNTER — Other Ambulatory Visit: Payer: Self-pay | Admitting: *Deleted

## 2023-12-10 DIAGNOSIS — D369 Benign neoplasm, unspecified site: Secondary | ICD-10-CM

## 2023-12-10 NOTE — Progress Notes (Signed)
 Looks like a lipoma.  Monitor for now.  If growning fast, pain, then will refer to surgeon

## 2024-01-02 ENCOUNTER — Ambulatory Visit (AMBULATORY_SURGERY_CENTER): Payer: 59

## 2024-01-02 VITALS — Ht 69.0 in | Wt 175.0 lb

## 2024-01-02 DIAGNOSIS — Z860100 Personal history of colon polyps, unspecified: Secondary | ICD-10-CM

## 2024-01-02 MED ORDER — NA SULFATE-K SULFATE-MG SULF 17.5-3.13-1.6 GM/177ML PO SOLN: 1.0000 | Freq: Once | ORAL | 0 refills | Status: AC

## 2024-01-02 NOTE — Progress Notes (Signed)

## 2024-01-19 IMAGING — MG MM DIGITAL SCREENING BILAT W/ TOMO AND CAD
8 series · 8 of 24 positions shown · non-contrast
Comparison: Previous exam(s).

CLINICAL DATA: Screening.

EXAM:
DIGITAL SCREENING BILATERAL MAMMOGRAM WITH TOMOSYNTHESIS AND CAD
TECHNIQUE: Bilateral screening digital craniocaudal and mediolateral oblique
mammograms were obtained. Bilateral screening digital breast
tomosynthesis was performed. The images were evaluated with
computer-aided detection.

[R MLO synth-2D]
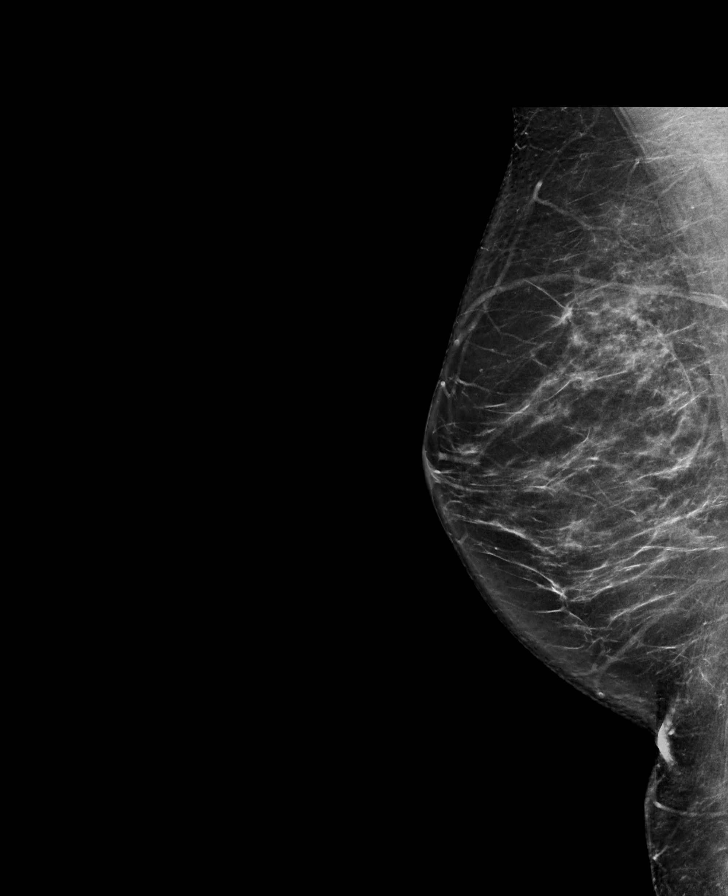

[R CC synth-2D]
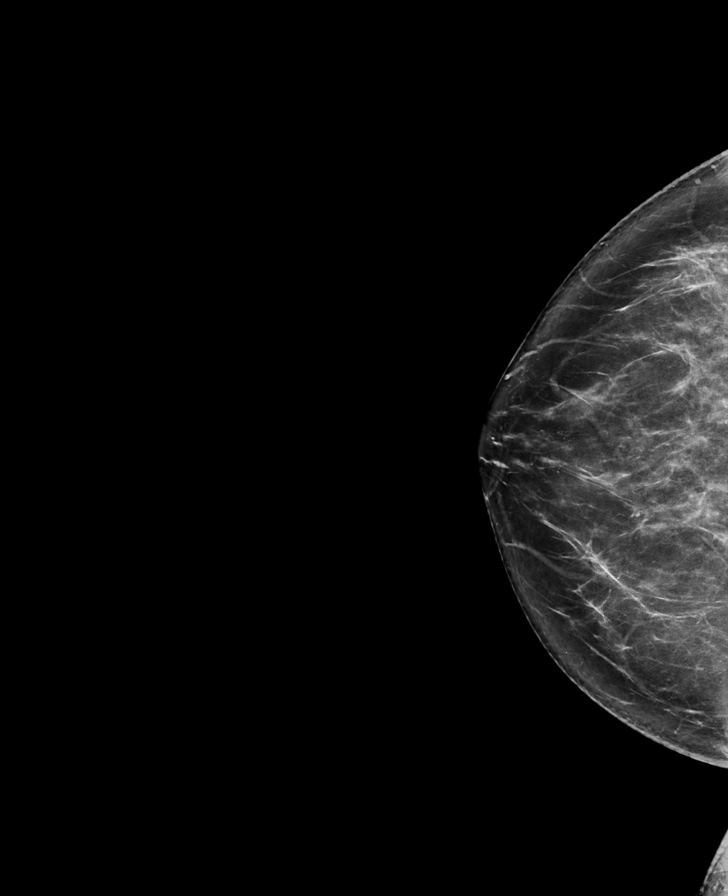

[L CC synth-2D]
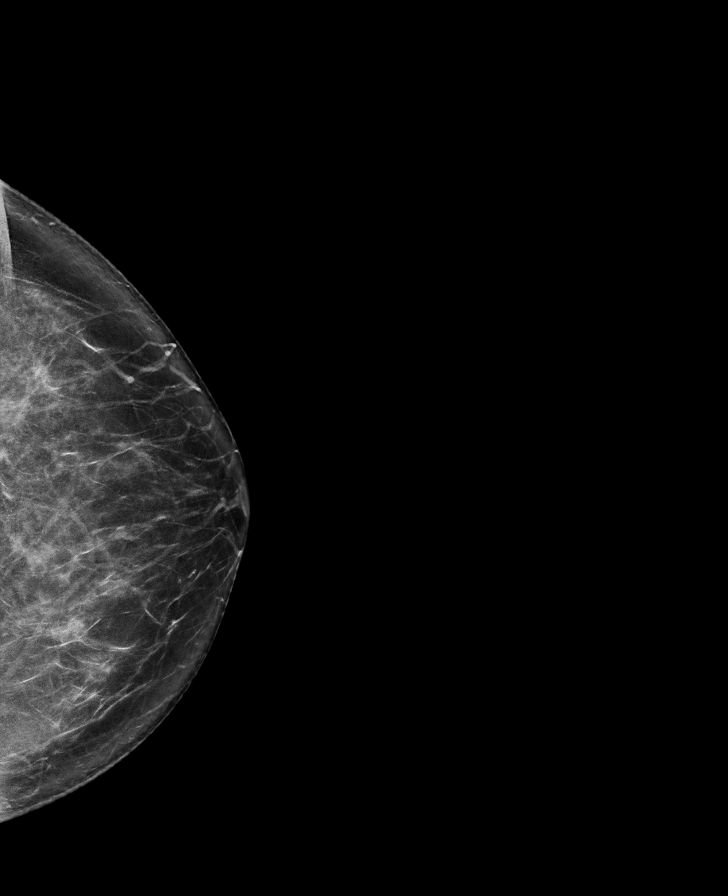

[L MLO synth-2D]
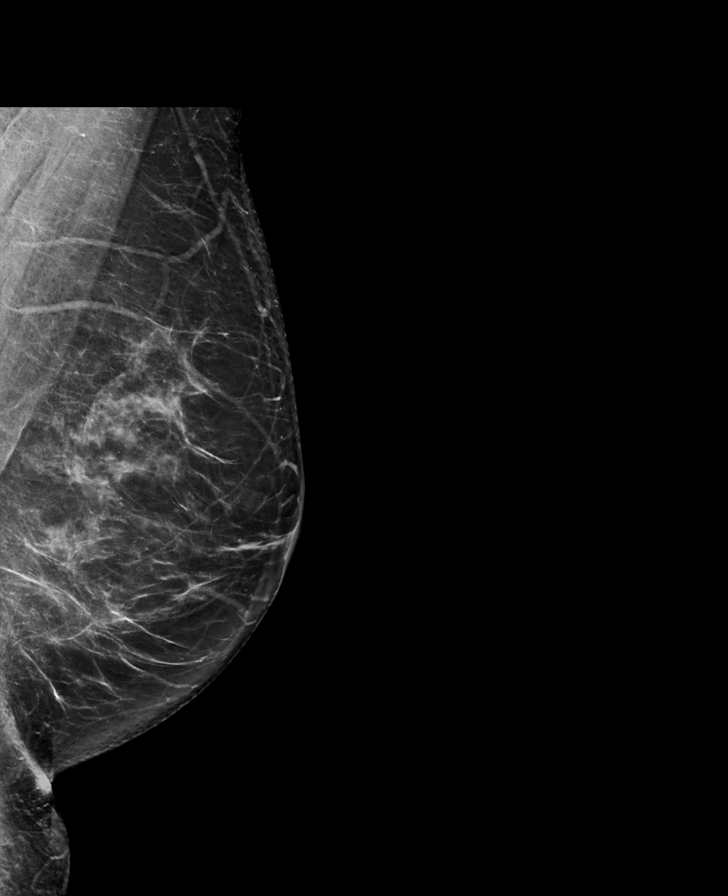

[R MLO tomo · tomo slice 41/82.0]
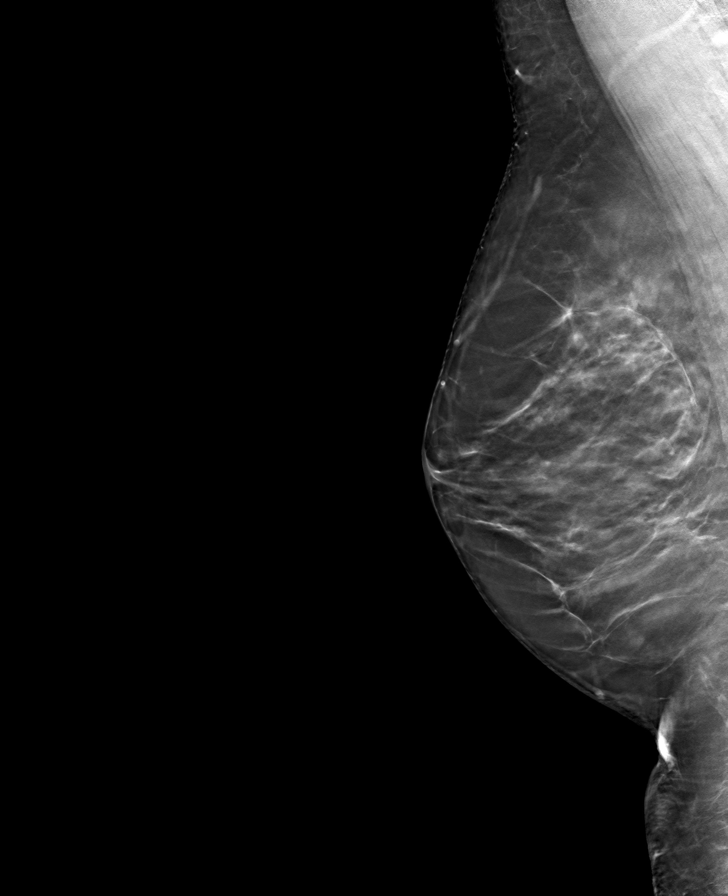

[L MLO tomo · tomo slice 43/84.0]
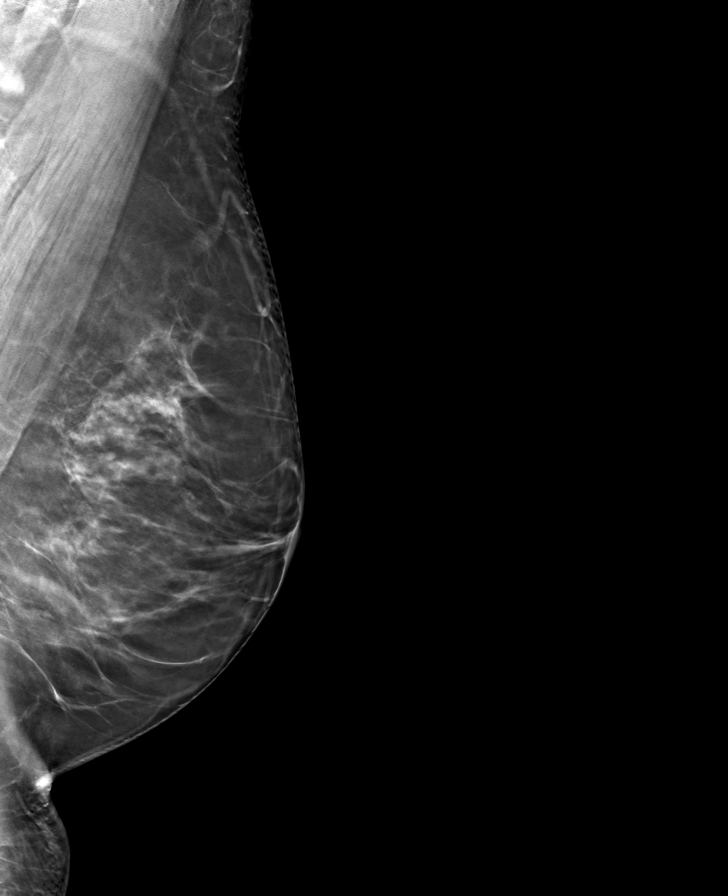

[R CC tomo · tomo slice 42/83.0]
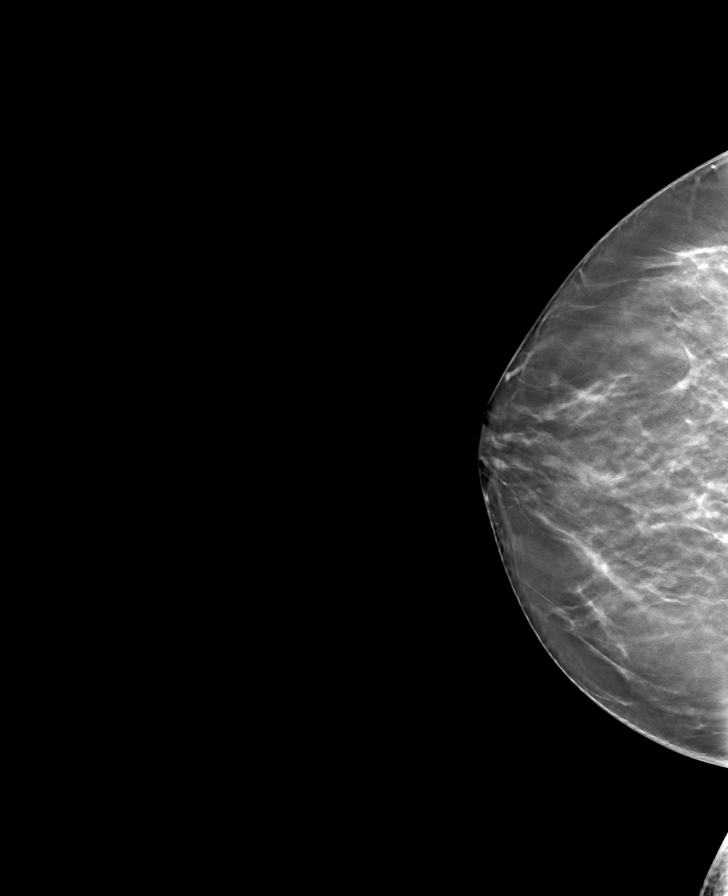

[L CC tomo · tomo slice 42/83.0]
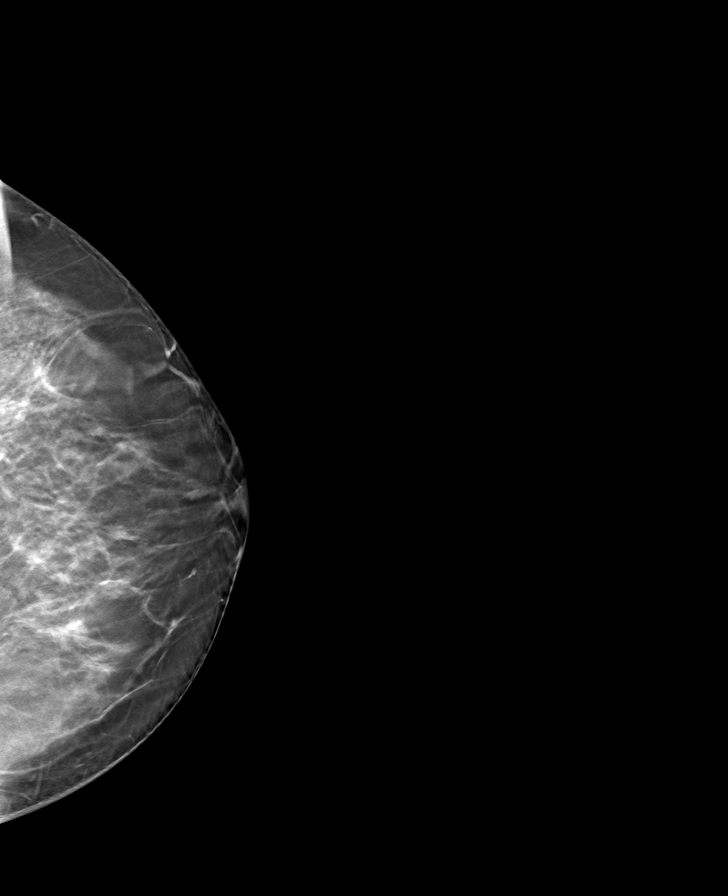

[8 of 24 positions shown; findings below may reference images not displayed]

ACR Breast Density Category b: There are scattered areas of
fibroglandular density.
FINDINGS: There are no findings suspicious for malignancy.
IMPRESSION: No mammographic evidence of malignancy. A result letter of this
screening mammogram will be mailed directly to the patient.

RECOMMENDATION:
Screening mammogram in one year. (Code:51-O-LD2)

BI-RADS CATEGORY  1: Negative.

## 2024-01-22 ENCOUNTER — Encounter: Payer: Self-pay | Admitting: Internal Medicine

## 2024-01-26 ENCOUNTER — Ambulatory Visit (AMBULATORY_SURGERY_CENTER): Payer: 59 | Admitting: Internal Medicine

## 2024-01-26 ENCOUNTER — Encounter: Payer: Self-pay | Admitting: Internal Medicine

## 2024-01-26 VITALS — BP 112/73 | HR 65 | Temp 97.2°F | Resp 12 | Ht 69.0 in | Wt 175.0 lb

## 2024-01-26 DIAGNOSIS — Z860100 Personal history of colon polyps, unspecified: Secondary | ICD-10-CM

## 2024-01-26 DIAGNOSIS — K5730 Diverticulosis of large intestine without perforation or abscess without bleeding: Secondary | ICD-10-CM

## 2024-01-26 DIAGNOSIS — Z860101 Personal history of adenomatous and serrated colon polyps: Secondary | ICD-10-CM

## 2024-01-26 DIAGNOSIS — D124 Benign neoplasm of descending colon: Secondary | ICD-10-CM | POA: Diagnosis not present

## 2024-01-26 DIAGNOSIS — Z1211 Encounter for screening for malignant neoplasm of colon: Secondary | ICD-10-CM

## 2024-01-26 MED ORDER — SODIUM CHLORIDE 0.9 % IV SOLN: 500.0000 mL | Freq: Once | INTRAVENOUS | Status: DC

## 2024-01-26 NOTE — Patient Instructions (Signed)
 Educational handout provided to patient related to Polyps and Diverticulosis  Resume previous diet  Continue present medications  Awaiting pathology results  YOU HAD AN ENDOSCOPIC PROCEDURE [facility] THE [facility]:   Refer to the procedure report that was given to you for any specific questions about what was found during the examination.  If the procedure report does not answer your questions, please call your gastroenterologist to clarify.  If you requested that your care partner not be given the details of your procedure findings, then the procedure report has been included in a sealed envelope for you to review at your convenience later.  YOU SHOULD EXPECT: Some feelings of bloating in the abdomen. Passage of more gas than usual.  Walking can help get rid of the air that was put into your GI tract during the procedure and reduce the bloating. If you had a lower endoscopy (such as a colonoscopy or flexible sigmoidoscopy) you may notice spotting of blood in your stool or on the toilet paper. If you underwent a bowel prep for your procedure, you may not have a normal bowel movement for a few days.  Please Note:  You might notice some irritation and congestion in your nose or some drainage.  This is from the oxygen used during your procedure.  There is no need for concern and it should clear up in a day or so.  SYMPTOMS TO REPORT IMMEDIATELY:  Following lower endoscopy (colonoscopy or flexible sigmoidoscopy):  Excessive amounts of blood in the stool  Significant tenderness or worsening of abdominal pains  Swelling of the abdomen that is new, acute  Fever of 100F or higher  For urgent or emergent issues, a gastroenterologist can be reached at any hour by calling [phone]. Do not use MyChart messaging for urgent concerns.    DIET:  We do recommend a small meal at first, but then you may proceed to your regular diet.  Drink plenty of fluids but you should avoid alcoholic  beverages for 24 hours.  ACTIVITY:  You should plan to take it easy for the rest of today and you should NOT DRIVE or use heavy machinery until tomorrow (because of the sedation medicines used during the test).    FOLLOW UP: Our staff will call the number listed on your records the next business day following your procedure.  We will call around 7:15- 8:00 am to check on you and address any questions or concerns that you may have regarding the information given to you following your procedure. If we do not reach you, we will leave a message.     If any biopsies were taken you will be contacted by phone or by letter within the next 1-3 weeks.  Please call us at [phone] if you have not heard about the biopsies in 3 weeks.    SIGNATURES/CONFIDENTIALITY: You and/or your care partner have signed paperwork which will be entered into your electronic medical record.  These signatures attest to the fact that that the information above on your After Visit Summary has been reviewed and is understood.  Full responsibility of the confidentiality of this discharge information lies with you and/or your care-partner.

## 2024-01-26 NOTE — Progress Notes (Signed)
 Pt awake, alert and oriented. VSS. Airway intact. SBAR complete to RN. All questions answered.

## 2024-01-26 NOTE — Progress Notes (Signed)
 HISTORY OF PRESENT ILLNESS:  Linda Orr is a 58 y.o. female with a history of sessile serrated polyp 2019.  Now for surveillance colonoscopy  REVIEW OF SYSTEMS:  All non-GI ROS negative except for  Past Medical History:  Diagnosis Date   Allergy    Egg, wheat, other food allergies   GERD (gastroesophageal reflux disease)    Heart murmur    MVP   Prediabetes     Past Surgical History:  Procedure Laterality Date   COLONOSCOPY  01/09/2018   LEEP  2014   TONSILLECTOMY      Social History Linda Orr  reports that she has never smoked. She has never used smokeless tobacco. She reports current alcohol use of about 2.0 standard drinks of alcohol per week. She reports that she does not use drugs.  family history is not on file.  Allergies  Allergen Reactions   Egg Shells     Other Reaction(s): Other  Sore Throat and Congestion   Molds & Smuts     Other Reaction(s): Other  Sore throat and Congestion   Other Other (See Comments)    TOMATOES    Reaction: Sore Throat and Congestion   Wheat     Other Reaction(s): Other  Sore Throat and Congestion       PHYSICAL EXAMINATION: Vital signs: BP 137/88   Pulse 80   Temp (!) 97.2 F (36.2 C) (Temporal)   Resp 14   Ht 5\' 9"  (1.753 m)   Wt 175 lb (79.4 kg)   SpO2 100%   BMI 25.84 kg/m  General: Well-developed, well-nourished, no acute distress HEENT: Sclerae are anicteric, conjunctiva pink. Oral mucosa intact Lungs: Clear Heart: Regular Abdomen: soft, nontender, nondistended, no obvious ascites, no peritoneal signs, normal bowel sounds. No organomegaly. Extremities: No edema Psychiatric: alert and oriented x3. Cooperative     ASSESSMENT:  Personal history of SSP   PLAN:   Surveillance colonoscopy

## 2024-01-26 NOTE — Progress Notes (Signed)
 Pt's states no medical or surgical changes since previsit or office visit.

## 2024-01-26 NOTE — Progress Notes (Signed)
 Called to room to assist during endoscopic procedure.  Patient ID and intended procedure confirmed with present staff. Received instructions for my participation in the procedure from the performing physician.

## 2024-01-26 NOTE — Op Note (Signed)
 [facility] Patient Name: Linda Orr Procedure Date: 01/26/2024 8:23 AM MRN: [id number] Endoscopist: Wilhemina Bonito. Marina Goodell , MD, [id number] Age: 58 Referring MD:  Date of Birth: October 19, 1966 Gender: Female Account #: [id number] Procedure:                Colonoscopy with cold snare polypectomy x 1 Indications:              High risk colon cancer surveillance: Personal                            history of sessile serrated colon polyp (less than                            10 mm in size) with no dysplasia. Prior examination                            with [facility] January 09, 2018 Medicines:                Monitored Anesthesia Care Procedure:                Pre-Anesthesia Assessment:                           - Prior to the procedure, a History and Physical                            was performed, and patient medications and                            allergies were reviewed. The patient's tolerance of                            previous anesthesia was also reviewed. The risks                            and benefits of the procedure and the sedation                            options and risks were discussed with the patient.                            All questions were answered, and informed consent                            was obtained. Prior Anticoagulants: The patient has                            taken no anticoagulant or antiplatelet agents.                            After reviewing the risks and benefits, the patient                            was deemed in satisfactory condition to undergo the  procedure.                           After obtaining informed consent, the colonoscope                            was passed under direct vision. Throughout the                            procedure, the patient's blood pressure, pulse, and                            oxygen saturations were monitored continuously. The                            Olympus Scope  SN: [id number] was introduced through                            the anus and advanced to the the cecum, identified                            by appendiceal orifice and ileocecal valve. The                            ileocecal valve, appendiceal orifice, and rectum                            were photographed. The quality of the bowel                            preparation was excellent. The colonoscopy was                            performed without difficulty. The patient tolerated                            the procedure well. The bowel preparation used was                            SUPREP via split dose instruction. Scope In: 8:26:11 AM Scope Out: 8:40:16 AM Scope Withdrawal Time: 0 hours 11 minutes 0 seconds  Total Procedure Duration: 0 hours 14 minutes 5 seconds  Findings:                 A 5 mm polyp was found in the descending colon. The                            polyp was removed with a cold snare. Resection and                            retrieval were complete.                           Many diverticula were found in the entire colon.  The exam was otherwise without abnormality on                            direct and retroflexion views. Complications:            No immediate complications. Estimated blood loss:                            None. Estimated Blood Loss:     Estimated blood loss: none. Impression:               - One 5 mm polyp in the descending colon, removed                            with a cold snare. Resected and retrieved.                           - Diverticulosis in the entire examined colon.                           - The examination was otherwise normal on direct                            and retroflexion views. Recommendation:           - Repeat colonoscopy in 5 years for surveillance.                           - Patient has a contact number available for                            emergencies. The signs and symptoms of  potential                            delayed complications were discussed with the                            patient. Return to normal activities tomorrow.                            Written discharge instructions were provided to the                            patient.                           - Resume previous diet.                           - Continue present medications.                           - Await pathology results. Wilhemina Bonito. Marina Goodell, MD 01/26/2024 8:48:59 AM This report has been signed electronically.

## 2024-01-27 ENCOUNTER — Telehealth: Payer: Self-pay | Admitting: *Deleted

## 2024-01-27 NOTE — Telephone Encounter (Signed)
  Follow up Call-     01/26/2024    7:17 AM  Call back number  Post procedure Call Back phone  # [phone]  Permission to leave phone message Yes     Patient questions:  Do you have a fever, pain , or abdominal swelling? No. Pain Score  0 *  Have you tolerated food without any problems? Yes.    Have you been able to return to your normal activities? Yes.    Do you have any questions about your discharge instructions: Diet   No. Medications  No. Follow up visit  No.  Do you have questions or concerns about your Care? No.  Actions: * If pain score is 4 or above: No action needed, pain <4.

## 2024-01-28 ENCOUNTER — Encounter: Payer: Self-pay | Admitting: Internal Medicine

## 2024-01-28 LAB — SURGICAL PATHOLOGY

## 2024-03-04 ENCOUNTER — Other Ambulatory Visit (HOSPITAL_COMMUNITY)
Admission: RE | Admit: 2024-03-04 | Discharge: 2024-03-04 | Disposition: A | Discharge status: Home / Self Care | Source: Ambulatory Visit | Attending: Obstetrics & Gynecology | Admitting: Obstetrics & Gynecology

## 2024-03-04 ENCOUNTER — Ambulatory Visit: Payer: 59 | Admitting: Obstetrics & Gynecology

## 2024-03-04 ENCOUNTER — Encounter: Payer: Self-pay | Admitting: Obstetrics & Gynecology

## 2024-03-04 VITALS — BP 136/82 | HR 79 | Ht 69.0 in | Wt 184.6 lb

## 2024-03-04 DIAGNOSIS — Z01419 Encounter for gynecological examination (general) (routine) without abnormal findings: Secondary | ICD-10-CM | POA: Diagnosis present

## 2024-03-04 DIAGNOSIS — N952 Postmenopausal atrophic vaginitis: Secondary | ICD-10-CM | POA: Diagnosis not present

## 2024-03-04 MED ORDER — ESTRADIOL 0.1 MG/GM VA CREA
TOPICAL_CREAM | VAGINAL | 3 refills | Status: AC
Start: 2024-03-04 — End: ?

## 2024-03-04 NOTE — Progress Notes (Signed)
 WELL-WOMAN EXAMINATION Patient name: Linda Orr MRN [id number]  Date of birth: September 17, 1966 Chief Complaint:   Gynecologic Exam  History of Present Illness:   Linda Orr is a 58 y.o. PM female being seen today for a routine well-woman exam.   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  Last pap 2024.  Last mammogram: 03/2023- changed to [facility] Last colonoscopy: 2/205     11/27/2023    3:00 PM 02/26/2023    8:39 AM 12/16/2022    3:27 PM  Depression screen PHQ 2/9  Decreased Interest 1 0 1  Down, Depressed, Hopeless 1 1 0  PHQ - 2 Score 2 1 1   Altered sleeping 0 1 0  Tired, decreased energy 1 1 1   Change in appetite 0 0 1  Feeling bad or failure about yourself  0 0 0  Trouble concentrating 0 0 0  Moving slowly or fidgety/restless 0 0 0  Suicidal thoughts 0 0 0  PHQ-9 Score 3 3 3   Difficult doing work/chores Not difficult at all  Not difficult at all      Review of Systems:   Pertinent items are noted in HPI Denies any headaches, blurred vision, fatigue, shortness of breath, chest pain, abdominal pain, bowel movements, urination, or intercourse unless otherwise stated above.  Pertinent History Reviewed:  Reviewed past medical,surgical, social and family history.  Reviewed problem list, medications and allergies. Physical Assessment:   Vitals:   03/04/24 0835  BP: 136/82  Pulse: 79  Weight: 184 lb 9.6 oz (83.7 kg)  Height: 5\' 9"  (1.753 m)  Body mass index is 27.26 kg/m.        Physical Examination:   General appearance - well appearing, and in no distress  Mental status - alert, oriented to person, place, and time  Psych:  She has a normal  mood and affect  Skin - warm and dry, normal color  Chest - effort normal, all lung fields clear to auscultation bilaterally  Heart - normal rate and regular rhythm  Neck:  midline trachea, no thyromegaly or nodules  Breasts - breasts appear normal, no suspicious masses, no skin or nipple changes or  axillary nodes  Abdomen - soft, nontender, nondistended, no masses or organomegaly  Pelvic - VULVA: normal appearing vulva with no masses, tenderness or lesions  VAGINA: normal appearing vagina with normal color and discharge, no lesions  CERVIX: atrophic and cervix flush to vaginal canal  Thin prep pap is done with HR HPV cotesting Atrophic changes noted on bimanual exam  UTERUS: uterus is felt to be normal size, shape, consistency and nontender  ADNEXA: No adnexal masses or tenderness noted.  Extremities:  No swelling or varicosities noted  Chaperone: Faith Rogue     Assessment & Plan:  1) Well-Woman Exam -pap collected -may consider colposcopy pending results -other preventive screening guidelines up to date   2) Vaginal atrophy -discussed the "why" of estrace, pt agreeable to restart medication including possible improvement of pap   No orders of the defined types were placed in this encounter.   Meds:  Meds ordered this encounter  Medications   estradiol (  ESTRACE VAGINAL) 0.1 MG/GM vaginal cream    Sig: Apply pea-sized amount (first line of applicator) at night once per week    Dispense:  42.5 g    Refill:  3    Follow-up: Return in about 1 year (around 03/04/2025) for Annual.   Myna Hidalgo, DO Attending Obstetrician & Gynecologist, [facility] for [facility], [facility]

## 2024-03-09 LAB — CYTOLOGY - PAP
Adequacy: ABSENT
Comment: NEGATIVE
Comment: NEGATIVE
Comment: NEGATIVE
HPV 16: NEGATIVE
HPV 18 / 45: NEGATIVE
High risk HPV: POSITIVE — AB

## 2024-03-10 ENCOUNTER — Encounter: Payer: Self-pay | Admitting: Obstetrics & Gynecology

## 2024-03-10 LAB — HM MAMMOGRAPHY

## 2024-03-11 ENCOUNTER — Encounter: Payer: Self-pay | Admitting: Family Medicine

## 2024-04-07 ENCOUNTER — Other Ambulatory Visit: Payer: Self-pay | Admitting: *Deleted

## 2024-04-07 DIAGNOSIS — N952 Postmenopausal atrophic vaginitis: Secondary | ICD-10-CM

## 2024-04-07 NOTE — Progress Notes (Signed)
 error

## 2024-04-16 ENCOUNTER — Ambulatory Visit: Payer: Self-pay | Admitting: Surgery

## 2024-04-16 NOTE — H&P (Signed)
 Linda Orr [id number]   Referring Provider:  Christel Cousins, MD   Subjective   Chief Complaint: New Consultation (BENIGN TUMOR ON BACK)     History of Present Illness:    58 year old woman with history of GERD, prediabetes, mitral valve prolapse who presents for evaluation of a suspected lipoma on her back.  She noted this about 6 months ago stating that her massage therapist found it.  No associated pain per se, but it does occasionally feel tight or irritated especially when she has to sit in a certain position for a long time and when back pain is flaring up; no infection or drainage, but some swelling noted.  She underwent an ultrasound for this in January which noted a 5.6 x 1 x 5 cm ovoid avascular lesion likely representing a benign lipoma.  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  This does entail a lot of travel and she finds that sitting in airplane seats aggravates the discomfort from the lipoma.   Review of Systems: A complete review of systems was obtained from the patient.  I have reviewed this information and discussed as appropriate with the patient.  See HPI as well for other ROS.   Medical History: History reviewed. No pertinent past medical history.  There is no problem list on file for this patient.   Past Surgical History:  Procedure Laterality Date   TONSILLECTOMY  1972     Allergies  Allergen Reactions   Egg Other (See Comments)    Sore Throat and Congestion  Other Reaction(s): Other    Sore Throat and Congestion   Mold Other (See Comments)    Sore throat and Congestion  Other Reaction(s): Other    Sore throat and Congestion   Other Other (See Comments)    TOMATOES    Reaction: Sore Throat and Congestion  TOMATOES  Reaction: Sore Throat and Congestion   Wheat Other (See Comments)    Sore Throat and Congestion  Other Reaction(s): Other    Sore Throat and Congestion    Current Outpatient  Medications on File Prior to Visit  Medication Sig Dispense Refill   estradioL  (ESTRACE ) 0.01 % (0.1 mg/gram) vaginal cream Apply pea-sized amount (first line of applicator) at night once per week     pantoprazole  (PROTONIX ) 40 MG DR tablet Take 1 tablet by mouth once daily     No current facility-administered medications on file prior to visit.    History reviewed. No pertinent family history.   Social History   Tobacco Use  Smoking Status Never  Smokeless Tobacco Never     Social History   Socioeconomic History   Marital status: Married  Tobacco Use   Smoking status: Never   Smokeless tobacco: Never  Vaping Use   Vaping status: Never Used  Substance and Sexual Activity   Alcohol use: Yes   Drug use: Never   Social Drivers of Corporate investment banker Strain: Patient Declined (10/08/2023)   Received from [facility]   Overall Financial Resource Strain (CARDIA)    Difficulty of Paying Living Expenses: Patient declined  Food Insecurity: Patient Declined (10/08/2023)   Received from [facility]   Hunger Vital Sign    Worried About Running Out of Food in the Last Year: Patient declined    Ran Out of Food in the Last Year: Patient declined  Transportation Needs: Patient Declined (10/08/2023)   Received from [facility] - Transportation  Lack of Transportation (Medical): Patient declined    Lack of Transportation (Non-Medical): Patient declined  Physical Activity: Insufficiently Active (10/08/2023)   Received from [facility]   Exercise Vital Sign    Days of Exercise per Week: 3 days    Minutes of Exercise per Session: 40 min  Stress: Stress Concern Present (10/08/2023)   Received from [facility] - Occupational Stress Questionnaire    Feeling of Stress : Rather much  Social Connections: Unknown (10/08/2023)   Received from [facility]   Social Connection and Isolation Panel [NHANES]    Frequency of Communication  with Friends and Family: More than three times a week    Frequency of Social Gatherings with Friends and Family: Patient declined    Attends Religious Services: Patient declined    Active Member of Clubs or Organizations: Patient declined    Marital Status: Married  Housing Stability: Unknown (04/16/2024)   Housing Stability Vital Sign    Homeless in the Last Year: No    Objective:    Vitals:   04/16/24 1540  BP: 100/68  Pulse: 82  Temp: 36.6 C (97.9 F)  SpO2: 99%  Weight: 84.4 kg (186 lb)  Height: 175.3 cm (5\' 9" )  PainSc: 0-No pain  PainLoc: Back    Body mass index is 27.47 kg/m.  Gen: A&Ox3, no distress  Chest: respiratory effort is normal. Neuro: no gross deficit Psych: appropriate mood and affect, normal insight/judgment intact  Skin: warm and dry.  Smooth, soft, mobile ovoid subcutaneous mass which is fairly well-circumscribed just medial to the right shoulder blade.  No overlying skin changes or tenderness.  Assessment and Plan:  Diagnoses and all orders for this visit:  Subcutaneous mass  Lipoma.  Desires excision.  We reviewed the procedure and risks including bleeding, infection, pain, scarring, wound healing problems or undesired cosmetic result, seroma/hematoma, and recurrence of the lesion.  Also discussed the option of ongoing monitoring and signs symptoms that should prompt her to return for further evaluation.  Questions welcomed and answered.  She would like to discuss scheduling options as well as cost with our schedulers but thinks she will likely proceed with surgery when she has further information.    Aldon Hung MD FACS

## 2024-04-21 ENCOUNTER — Encounter: Payer: 59 | Admitting: Family Medicine

## 2024-05-04 ENCOUNTER — Encounter: Payer: Self-pay | Admitting: Family Medicine

## 2024-05-05 ENCOUNTER — Encounter: Payer: Self-pay | Admitting: Family Medicine

## 2024-05-12 ENCOUNTER — Encounter: Payer: Self-pay | Admitting: Family Medicine

## 2024-06-08 ENCOUNTER — Ambulatory Visit: Admitting: Family Medicine

## 2024-06-11 ENCOUNTER — Ambulatory Visit: Admitting: Family Medicine

## 2024-06-15 ENCOUNTER — Ambulatory Visit: Admitting: Family Medicine

## 2024-06-15 ENCOUNTER — Encounter: Payer: Self-pay | Admitting: Family Medicine

## 2024-06-15 VITALS — BP 113/78 | HR 86 | Temp 97.5°F | Resp 18 | Ht 69.0 in | Wt 185.0 lb

## 2024-06-15 DIAGNOSIS — Z23 Encounter for immunization: Secondary | ICD-10-CM

## 2024-06-15 DIAGNOSIS — Z0000 Encounter for general adult medical examination without abnormal findings: Secondary | ICD-10-CM | POA: Diagnosis not present

## 2024-06-15 MED ORDER — VIVOTIF PO CPDR: 1.0000 | DELAYED_RELEASE_CAPSULE | ORAL | 0 refills | Status: DC

## 2024-06-15 NOTE — Patient Instructions (Signed)

## 2024-06-15 NOTE — Progress Notes (Signed)
 " Phone [phone]   Subjective:   Patient is a 58 y.o. female presenting for annual physical.    Chief Complaint  Patient presents with   Annual Exam    CPE    Annual-Longevity in family. From [place] Discussed the use of AI scribe software for clinical note transcription with the patient, who gave verbal consent to proceed.  History of Present Illness Linda Orr is a 58 year old female who presents for an annual physical exam.  She has a demanding work schedule that affects her ability to exercise regularly, contributing to stress and occasional gastrointestinal issues such as bloating and difficulty with bowel movements. She attributes these symptoms to stress and travel. She is currently taking fiber supplements, which have been beneficial. No vomiting, diarrhea, or constipation.  She has a history of back issues, which are exacerbated by travel. She is currently seeing a chiropractor and has obtained a sit-stand desk. She is trying to incorporate more physical activity into her routine, such as walking during lunch breaks and cycling on weekends.  She has experienced significant stress, particularly noting a close call with burnout in December. She is working on recognizing stress and managing it, including consulting with a firefighter and prioritizing self-care. Her energy levels decrease significantly when stressed but are currently improving.  She has not started using Estrace  cream yet. She is considering getting a tetanus shot, as she is unsure of her last vaccination date, and is contemplating vaccinations for travel to the [place]. No major headaches, migraines, dizziness, coughing, wheezing, shortness of breath, chest pains, heart racing, or suicidal thoughts.    See problem oriented charting- ROS- ROS: Gen: no fever, chills  Skin: no rash, itching ENT: no ear pain, ear drainage, nasal congestion, rhinorrhea, sinus pressure, sore throat Eyes: no blurry  vision, double vision Resp: no cough, wheeze,SOB CV: no CP, palpitations, LE edema,  GI: no heartburn, n/v/d/c, abd pain GU: no dysuria, urgency, frequency, hematuria MSK: chronic back Neuro: no dizziness, headache, weakness, vertigo Psych: no depression, anxiety, insomnia, SI -stressed.    The following were reviewed and entered/updated in epic: Past Medical History:  Diagnosis Date   Allergy    Egg, wheat, other food allergies   GERD (gastroesophageal reflux disease)    Heart murmur    MVP   Prediabetes    Patient Active Problem List   Diagnosis Date Noted   Cervicalgia 10/09/2023   Chronic bilateral low back pain without sciatica 10/09/2023   Gastroesophageal reflux disease without esophagitis 10/08/2023   Prediabetes 03/24/2023   Pure hypercholesterolemia 03/24/2023   Past Surgical History:  Procedure Laterality Date   COLONOSCOPY  01/09/2018   LEEP  2014   TONSILLECTOMY      Family History  Problem Relation Age of Onset   Alcohol abuse Mother    Diabetes Mother    Breast cancer Neg Hx    Colon cancer Neg Hx    Rectal cancer Neg Hx    Stomach cancer Neg Hx    Esophageal cancer Neg Hx     Medications- reviewed and updated Current Outpatient Medications  Medication Sig Dispense Refill   pantoprazole  (PROTONIX ) 40 MG tablet Take 1 tablet by mouth daily. (Patient taking differently: Take 1 tablet by mouth as needed.)     Probiotic Product (PROBIOTIC PO) Take by mouth as needed.     typhoid (VIVOTIF ) DR capsule Take 1 capsule by mouth every other day. 4 capsule 0   estradiol  (ESTRACE  VAGINAL) 0.1 MG/GM  vaginal cream Apply pea-sized amount (first line of applicator) at night once per week (Patient not taking: Reported on 06/15/2024) 42.5 g 3   No current facility-administered medications for this visit.    Allergies-reviewed and updated Allergies  Allergen Reactions   Egg Shells     Other Reaction(s): Other  Sore Throat and Congestion   Egg Solids, Whole  Other (See Comments)    Sore Throat and Congestion    Other Reaction(s): Other  Sore Throat and Congestion   Molds & Smuts Other (See Comments)    Other Reaction(s): Other  Sore throat and Congestion  Sore throat and Congestion    Other Reaction(s): Other  Sore throat and Congestion   Other Other (See Comments)    TOMATOES    Reaction: Sore Throat and Congestion  TOMATOES  Reaction: Sore Throat and Congestion    TOMATOES  Reaction: Sore Throat and Congestion   Wheat Other (See Comments)    Other Reaction(s): Other  Sore Throat and Congestion  Sore Throat and Congestion    Other Reaction(s): Other  Sore Throat and Congestion    Social History   Social History Narrative   Not on file   Objective  Objective:  BP 113/78   Pulse 86   Temp (!) 97.5 F (36.4 C) (Temporal)   Resp 18   Ht 5' 9 (1.753 m)   Wt 185 lb (83.9 kg)   SpO2 96%   BMI 27.32 kg/m  Physical Exam  Gen: WDWN NAD HEENT: NCAT, conjunctiva not injected, sclera nonicteric TM WNL B, OP moist, no exudates  NECK:  supple, no thyromegaly, no nodes, no carotid bruits CARDIAC: RRR, S1S2+, no murmur. DP 2+B LUNGS: CTAB. No wheezes ABDOMEN:  BS+, soft, NTND, No HSM, no masses EXT:  no edema MSK: no gross abnormalities. MS 5/5 all 4 NEURO: A&O x3.  CN II-XII intact.  PSYCH: normal mood. Good eye contact     Assessment and Plan   Health Maintenance counseling: 1. Anticipatory guidance: Patient counseled regarding regular dental exams q6 months, eye exams,  avoiding smoking and second hand smoke, limiting alcohol to 1 beverage per day, no illicit drugs.   2. Risk factor reduction:  Advised patient of need for regular exercise and diet rich and fruits and vegetables to reduce risk of heart attack and stroke. Exercise- encouraged.  Wt Readings from Last 3 Encounters:  06/15/24 185 lb (83.9 kg)  03/04/24 184 lb 9.6 oz (83.7 kg)  01/26/24 175 lb (79.4 kg)   3. Immunizations/screenings/ancillary  studies Immunization History  Administered Date(s) Administered   Influenza Inj Mdck Quad Pf 08/22/2017, 08/26/2019, 11/17/2022   Influenza, Quadrivalent, Recombinant, Inj, Pf 08/29/2021   Influenza,trivalent, recombinat, inj, PF 08/28/2023   Influenza-Unspecified 11/17/2022   PFIZER(Purple Top)SARS-COV-2 Vaccination 02/05/2020, 03/04/2020, 10/19/2020   Pfizer Covid-19 Vaccine Bivalent Booster 44yrs & up 08/27/2021   Pfizer(Comirnaty)Fall Seasonal Vaccine 12 years and older 11/17/2022, 08/28/2023   Tdap 12/02/2008   Health Maintenance Due  Topic Date Due   Hepatitis B Vaccines (1 of 3 - 19+ 3-dose series) Never done    4. Cervical cancer screening- utd 5. Breast cancer screening-  mammogram utd 6. Colon cancer screening - utd 7. Skin cancer screening- advised regular sunscreen use. Denies worrisome, changing, or new skin lesions.  8. Birth control/STD check- menopause 9. Osteoporosis screening- n/a 10. Smoking associated screening - non smoker  Wellness examination -     Lipid panel; Future -     Comprehensive metabolic panel  with GFR; Future -     CBC with Differential/Platelet; Future -     Hemoglobin A1c; Future -     TSH; Future  Need for tetanus booster -     Tdap vaccine greater than or equal to 7yo IM  Other orders -     Vivotif ; Take 1 capsule by mouth every other day.  Dispense: 4 capsule; Refill: 0   Wellness-anticipatory guidance.  Work on Diet/Exercise  Check CBC,CMP,lipids,TSH, A1C.  F/u 1 yr  Tdap given Assessment and Plan Assessment & Plan Stress-related gastrointestinal symptoms   Bloating and constipation are attributed to stress and travel. Fiber supplements provide relief. Symptoms are likely exacerbated by a high-stress work environment and frequent travel. Potential benefits of probiotics and stool softeners were discussed. Continue fiber supplementation. Consider a stool softener if symptoms persist and probiotics to aid digestion.  Chronic back pain    Back pain occurs particularly post-travel. Chiropractic care and a sit-stand desk alleviate symptoms. Regular stretching and self-care practices are beneficial. Improvement is noted with chiropractic care and massage therapy. Continue chiropractic care, use the sit-stand desk, engage in regular stretching exercises, and continue massage therapy twice a month.  General Health Maintenance   She is due for a tetanus booster, last administered >six years ago but unknown. Considering the shingles vaccine despite past adverse reactions to the chickenpox vaccine. The shingles vaccine is non-live. Considering hepatitis A and typhoid vaccinations due to potential travel to the [place]. She is up to date on mammograms, pap smears, and colonoscopy. The importance of hepatitis A vaccination for travel to areas with poor sanitation and the option of typhoid vaccination with Vivotif , a live attenuated oral vaccine, were discussed. Administer the tetanus booster, discuss and consider the shingles vaccine, consider hepatitis A vaccination(thinks she got for travel to [place] in past), prescribe Vivotif  for typhoid prevention, and ensure all routine screenings are up to date.  Follow-up   She requires fasting blood work as she was not fasting during this visit. Schedule an appointment for fasting blood work.    Recommended follow up: Return in about 1 year (around 06/15/2025) for annual physical.   soon fasting labs.  Lab/Order associations:return fasting  Jenkins CHRISTELLA Carrel, MD   "

## 2024-06-17 ENCOUNTER — Ambulatory Visit: Payer: Self-pay | Admitting: Family Medicine

## 2024-06-17 ENCOUNTER — Other Ambulatory Visit

## 2024-06-17 DIAGNOSIS — Z0000 Encounter for general adult medical examination without abnormal findings: Secondary | ICD-10-CM | POA: Diagnosis not present

## 2024-06-17 LAB — LIPID PANEL
Cholesterol: 238 mg/dL — ABNORMAL HIGH (ref 0–200)
HDL: 54.1 mg/dL (ref >39.00)
LDL Cholesterol: 167 mg/dL — ABNORMAL HIGH (ref 0–99)
NonHDL: 184.31
Total CHOL/HDL Ratio: 4
Triglycerides: 85 mg/dL (ref 0.0–149.0)
VLDL: 17 mg/dL (ref 0.0–40.0)

## 2024-06-17 LAB — CBC WITH DIFFERENTIAL/PLATELET
Basophils Absolute: 0 K/uL (ref 0.0–0.1)
Basophils Relative: 0.6 % (ref 0.0–3.0)
Eosinophils Absolute: 0 K/uL (ref 0.0–0.7)
Eosinophils Relative: 0.5 % (ref 0.0–5.0)
HCT: 43.3 % (ref 36.0–46.0)
Hemoglobin: 14 g/dL (ref 12.0–15.0)
Lymphocytes Relative: 24 % (ref 12.0–46.0)
Lymphs Abs: 1.5 K/uL (ref 0.7–4.0)
MCHC: 32.2 g/dL (ref 30.0–36.0)
MCV: 80.7 fl (ref 78.0–100.0)
Monocytes Absolute: 0.6 K/uL (ref 0.1–1.0)
Monocytes Relative: 9.1 % (ref 3.0–12.0)
Neutro Abs: 4 K/uL (ref 1.4–7.7)
Neutrophils Relative %: 65.8 % (ref 43.0–77.0)
Platelets: 342 K/uL (ref 150.0–400.0)
RBC: 5.37 Mil/uL — ABNORMAL HIGH (ref 3.87–5.11)
RDW: 13.4 % (ref 11.5–15.5)
WBC: 6.2 K/uL (ref 4.0–10.5)

## 2024-06-17 LAB — HEMOGLOBIN A1C: Hgb A1c MFr Bld: 6.2 % (ref 4.6–6.5)

## 2024-06-17 LAB — COMPREHENSIVE METABOLIC PANEL WITH GFR
ALT: 14 U/L (ref 0–35)
AST: 18 U/L (ref 0–37)
Albumin: 4.2 g/dL (ref 3.5–5.2)
Alkaline Phosphatase: 34 U/L — ABNORMAL LOW (ref 39–117)
BUN: 13 mg/dL (ref 6–23)
CO2: 27 meq/L (ref 19–32)
Calcium: 9 mg/dL (ref 8.4–10.5)
Chloride: 105 meq/L (ref 96–112)
Creatinine, Ser: 0.79 mg/dL (ref 0.40–1.20)
GFR: 82.6 mL/min (ref >60.00)
Glucose, Bld: 102 mg/dL — ABNORMAL HIGH (ref 70–99)
Potassium: 4.1 meq/L (ref 3.5–5.1)
Sodium: 140 meq/L (ref 135–145)
Total Bilirubin: 0.5 mg/dL (ref 0.2–1.2)
Total Protein: 6.7 g/dL (ref 6.0–8.3)

## 2024-06-17 LAB — TSH: TSH: 2.36 u[IU]/mL (ref 0.35–5.50)

## 2024-06-17 NOTE — Progress Notes (Signed)
 A1C(3 month average of sugars) is elevated.  This is considered PreDiabetes.  Work on diet-decrease sugars and starches and aim for 30 minutes of exercise 5 days/week to prevent progression to diabetes  Your cholesterol levels are elevated.  Work on low cholesterol and lower carbs/sugars diet and  get exercise to try to lower your cholesterol.  Does she want to keep working on diet/exercise for both and f/u 6 months, or consider meds for cholesterol?

## 2024-07-09 NOTE — Patient Instructions (Signed)
 SURGICAL WAITING ROOM VISITATION  Patients having surgery or a procedure may have no more than 2 support people in the waiting area - these visitors may rotate.    Children under the age of 30 must have an adult with them who is not the patient.  Visitors with respiratory illnesses are discouraged from visiting and should remain at home.  If the patient needs to stay at the hospital during part of their recovery, the visitor guidelines for inpatient rooms apply. Pre-op nurse will coordinate an appropriate time for 1 support person to accompany patient in pre-op.  This support person may not rotate.    Please refer to the [facility] website for the visitor guidelines for Inpatients (after your surgery is over and you are in a regular room).       Your procedure is scheduled on:  07/21/2024    Report to [facility] Main Entrance    Report to admitting at  0800 AM   Call this number if you have problems the morning of surgery [phone]   Do not eat food :After Midnight.   After Midnight you may have the following liquids until __ 0700____ AM  DAY OF SURGERY  Water Non-Citrus Juices (without pulp, NO RED-Apple, White grape, White cranberry) Black Coffee (NO MILK/CREAM OR CREAMERS, sugar ok)  Clear Tea (NO MILK/CREAM OR CREAMERS, sugar ok) regular and decaf                             Plain Jell-O (NO RED)                                           Fruit ices (not with fruit pulp, NO RED)                                     Popsicles (NO RED)                                                               Sports drinks like Gatorade (NO RED)                           If you have questions, please contact your surgeon's office.       Oral Hygiene is also important to reduce your risk of infection.                                    Remember - BRUSH YOUR TEETH THE MORNING OF SURGERY WITH YOUR REGULAR TOOTHPASTE  DENTURES WILL BE REMOVED PRIOR TO SURGERY PLEASE DO NOT  APPLY Poly grip OR ADHESIVES!!!   Do NOT smoke after Midnight   Stop all vitamins and herbal supplements 7 days before surgery.   Take these medicines the morning of surgery with A SIP OF WATER:   DO NOT TAKE ANY ORAL DIABETIC MEDICATIONS DAY OF YOUR SURGERY  Bring CPAP mask and tubing day of surgery.  You may not have any metal on your body including hair pins, jewelry, and body piercing             Do not wear make-up, lotions, powders, perfumes/cologne, or deodorant  Do not wear nail polish including gel and S&S, artificial/acrylic nails, or any other type of covering on natural nails including finger and toenails. If you have artificial nails, gel coating, etc. that needs to be removed by a nail salon please have this removed prior to surgery or surgery may need to be canceled/ delayed if the surgeon/ anesthesia feels like they are unable to be safely monitored.   Do not shave  48 hours prior to surgery.               Men may shave face and neck.   Do not bring valuables to the hospital. [facility] IS NOT             RESPONSIBLE   FOR VALUABLES.   Contacts, glasses, dentures or bridgework may not be worn into surgery.   Bring small overnight bag day of surgery.   DO NOT BRING YOUR HOME MEDICATIONS TO THE HOSPITAL. PHARMACY WILL DISPENSE MEDICATIONS LISTED ON YOUR MEDICATION LIST TO YOU DURING YOUR ADMISSION IN THE HOSPITAL!    Patients discharged on the day of surgery will not be allowed to drive home.  Someone NEEDS to stay with you for the first 24 hours after anesthesia.   Special Instructions: Bring a copy of your healthcare power of attorney and living will documents the day of surgery if you haven't scanned them before.              Please read over the following fact sheets you were given: IF YOU HAVE QUESTIONS ABOUT YOUR PRE-OP INSTRUCTIONS PLEASE CALL [phone].   If you received a COVID test during your pre-op visit  it is requested  that you wear a mask when out in public, stay away from anyone that may not be feeling well and notify your surgeon if you develop symptoms. If you test positive for Covid or have been in contact with anyone that has tested positive in the last 10 days please notify you surgeon.     - Preparing for Surgery Before surgery, you can play an important role.  Because skin is not sterile, your skin needs to be as free of germs as possible.  You can reduce the number of germs on your skin by washing with CHG (chlorahexidine gluconate) soap before surgery.  CHG is an antiseptic cleaner which kills germs and bonds with the skin to continue killing germs even after washing. Please DO NOT use if you have an allergy to CHG or antibacterial soaps.  If your skin becomes reddened/irritated stop using the CHG and inform your nurse when you arrive at Short Stay. Do not shave (including legs and underarms) for at least 48 hours prior to the first CHG shower.  You may shave your face/neck. Please follow these instructions carefully:  1.  Shower with CHG Soap the night before surgery and the  morning of Surgery.  2.  If you choose to wash your hair, wash your hair first as usual with your  normal  shampoo.  3.  After you shampoo, rinse your hair and body thoroughly to remove the  shampoo.  4.  Use CHG as you would any other liquid soap.  You can apply chg directly  to the skin and wash                       Gently with a scrungie or clean washcloth.  5.  Apply the CHG Soap to your body ONLY FROM THE NECK DOWN.   Do not use on face/ open                           Wound or open sores. Avoid contact with eyes, ears mouth and genitals (private parts).                       Wash face,  Genitals (private parts) with your normal soap.             6.  Wash thoroughly, paying special attention to the area where your surgery  will be performed.  7.  Thoroughly rinse your body with warm water  from the neck down.  8.  DO NOT shower/wash with your normal soap after using and rinsing off  the CHG Soap.                9.  Pat yourself dry with a clean towel.            10.  Wear clean pajamas.            11.  Place clean sheets on your bed the night of your first shower and do not  sleep with pets. Day of Surgery : Do not apply any lotions/deodorants the morning of surgery.  Please wear clean clothes to the hospital/surgery center.  FAILURE TO FOLLOW THESE INSTRUCTIONS MAY RESULT IN THE CANCELLATION OF YOUR SURGERY PATIENT SIGNATURE_________________________________  NURSE SIGNATURE__________________________________  ________________________________________________________________________

## 2024-07-13 ENCOUNTER — Encounter (HOSPITAL_COMMUNITY): Payer: Self-pay

## 2024-07-13 ENCOUNTER — Other Ambulatory Visit: Payer: Self-pay

## 2024-07-13 ENCOUNTER — Encounter (HOSPITAL_COMMUNITY)
Admission: RE | Admit: 2024-07-13 | Discharge: 2024-07-13 | Disposition: A | Discharge status: Home / Self Care | Source: Ambulatory Visit | Attending: Surgery | Admitting: Surgery

## 2024-07-13 VITALS — BP 131/89 | HR 76 | Temp 98.3°F | Resp 16 | Ht 69.0 in | Wt 183.0 lb

## 2024-07-13 DIAGNOSIS — Z01810 Encounter for preprocedural cardiovascular examination: Secondary | ICD-10-CM | POA: Diagnosis present

## 2024-07-13 DIAGNOSIS — Z01818 Encounter for other preprocedural examination: Secondary | ICD-10-CM | POA: Diagnosis not present

## 2024-07-13 DIAGNOSIS — Z01812 Encounter for preprocedural laboratory examination: Secondary | ICD-10-CM | POA: Diagnosis present

## 2024-07-13 HISTORY — DX: Other complications of anesthesia, initial encounter: T88.59XA

## 2024-07-13 LAB — CBC
HCT: 46.3 % — ABNORMAL HIGH (ref 36.0–46.0)
Hemoglobin: 14.2 g/dL (ref 12.0–15.0)
MCH: 25.3 pg — ABNORMAL LOW (ref 26.0–34.0)
MCHC: 30.7 g/dL (ref 30.0–36.0)
MCV: 82.5 fL (ref 80.0–100.0)
Platelets: 372 K/uL (ref 150–400)
RBC: 5.61 MIL/uL — ABNORMAL HIGH (ref 3.87–5.11)
RDW: 13.2 % (ref 11.5–15.5)
WBC: 7 K/uL (ref 4.0–10.5)
nRBC: 0 % (ref 0.0–0.2)

## 2024-07-13 LAB — BASIC METABOLIC PANEL WITH GFR
Anion gap: 10 (ref 5–15)
BUN: 14 mg/dL (ref 6–20)
CO2: 25 mmol/L (ref 22–32)
Calcium: 9.3 mg/dL (ref 8.9–10.3)
Chloride: 103 mmol/L (ref 98–111)
Creatinine, Ser: 0.77 mg/dL (ref 0.44–1.00)
GFR, Estimated: >60 mL/min (ref >60)
Glucose, Bld: 116 mg/dL — ABNORMAL HIGH (ref 70–99)
Potassium: 4.9 mmol/L (ref 3.5–5.1)
Sodium: 138 mmol/L (ref 135–145)

## 2024-07-13 NOTE — Progress Notes (Signed)
 Anesthesia Review:  PCP: Jenkins Carrel LOV 06/15/24  Cardiologist : none   PPM/ ICD: Device Orders: Rep Notified:  Chest x-ray : EKG : 07/13/24  Echo : Stress test: Cardiac Cath :   Activity level: can do a flight of stairs wtihout difficutly  Sleep Study/ CPAP : none  Fasting Blood Sugar :      / Checks Blood Sugar -- times a day:    Hgba1c-06/17/24-6.7   Blood Thinner/ Instructions /Last Dose: ASA / Instructions/ Last Dose :    01/26/24-colonoscopy   Wedding band will not come off per pt .  Pt will try to get off but usually will not come off per pt.    Anesthesia Complications- Pt states at preop heart rate drops and slow to wake up.  With anesthesia

## 2024-07-21 ENCOUNTER — Ambulatory Visit (HOSPITAL_COMMUNITY): Payer: Self-pay | Admitting: Medical

## 2024-07-21 ENCOUNTER — Encounter (HOSPITAL_COMMUNITY)
Admission: RE | Disposition: A | Discharge status: Home / Self Care | Payer: Self-pay | Source: Home / Self Care | Attending: Surgery

## 2024-07-21 ENCOUNTER — Ambulatory Visit (HOSPITAL_COMMUNITY)
Admission: RE | Admit: 2024-07-21 | Discharge: 2024-07-21 | Disposition: A | Discharge status: Home / Self Care | Attending: Surgery | Admitting: Surgery

## 2024-07-21 ENCOUNTER — Encounter (HOSPITAL_COMMUNITY): Payer: Self-pay | Admitting: Surgery

## 2024-07-21 ENCOUNTER — Other Ambulatory Visit: Payer: Self-pay

## 2024-07-21 ENCOUNTER — Ambulatory Visit (HOSPITAL_COMMUNITY): Admitting: Certified Registered"

## 2024-07-21 DIAGNOSIS — R7303 Prediabetes: Secondary | ICD-10-CM | POA: Insufficient documentation

## 2024-07-21 DIAGNOSIS — R222 Localized swelling, mass and lump, trunk: Secondary | ICD-10-CM | POA: Diagnosis not present

## 2024-07-21 DIAGNOSIS — D171 Benign lipomatous neoplasm of skin and subcutaneous tissue of trunk: Secondary | ICD-10-CM | POA: Insufficient documentation

## 2024-07-21 DIAGNOSIS — Z01818 Encounter for other preprocedural examination: Secondary | ICD-10-CM

## 2024-07-21 DIAGNOSIS — R229 Localized swelling, mass and lump, unspecified: Secondary | ICD-10-CM | POA: Diagnosis present

## 2024-07-21 DIAGNOSIS — E7800 Pure hypercholesterolemia, unspecified: Secondary | ICD-10-CM

## 2024-07-21 DIAGNOSIS — K219 Gastro-esophageal reflux disease without esophagitis: Secondary | ICD-10-CM | POA: Insufficient documentation

## 2024-07-21 HISTORY — PX: EXCISION OF BACK LESION: SHX6597

## 2024-07-21 SURGERY — EXCISION, LESION, BACK
Anesthesia: Monitor Anesthesia Care

## 2024-07-21 MED ORDER — PROPOFOL 500 MG/50ML IV EMUL
INTRAVENOUS | Status: AC
Start: 2024-07-21 — End: 2024-07-21
  Filled 2024-07-21: qty 50

## 2024-07-21 MED ORDER — 0.9 % SODIUM CHLORIDE (POUR BTL) OPTIME
TOPICAL | Status: DC | PRN
  Administered 2024-07-21: 1000 mL

## 2024-07-21 MED ORDER — DEXMEDETOMIDINE HCL IN NACL 200 MCG/50ML IV SOLN
INTRAVENOUS | Status: DC | PRN
Start: 2024-07-21 — End: 2024-07-21
  Administered 2024-07-21 (×2): 4 ug via INTRAVENOUS

## 2024-07-21 MED ORDER — FENTANYL CITRATE PF 50 MCG/ML IJ SOSY: 25.0000 ug | PREFILLED_SYRINGE | INTRAMUSCULAR | Status: DC | PRN

## 2024-07-21 MED ORDER — PROPOFOL 500 MG/50ML IV EMUL
INTRAVENOUS | Status: DC | PRN
  Administered 2024-07-21: 125 ug/kg/min via INTRAVENOUS

## 2024-07-21 MED ORDER — ORAL CARE MOUTH RINSE: 15.0000 mL | Freq: Once | OROMUCOSAL | Status: AC

## 2024-07-21 MED ORDER — CHLORHEXIDINE GLUCONATE 4 % EX SOLN: 60.0000 mL | Freq: Once | CUTANEOUS | Status: DC

## 2024-07-21 MED ORDER — BUPIVACAINE-EPINEPHRINE (PF) 0.25% -1:200000 IJ SOLN
INTRAMUSCULAR | Status: AC
  Filled 2024-07-21: qty 30

## 2024-07-21 MED ORDER — FENTANYL CITRATE (PF) 100 MCG/2ML IJ SOLN
INTRAMUSCULAR | Status: AC
  Filled 2024-07-21: qty 2

## 2024-07-21 MED ORDER — ACETAMINOPHEN 500 MG PO TABS
1000.0000 mg | ORAL_TABLET | ORAL | Status: AC
  Administered 2024-07-21: 1000 mg via ORAL
  Filled 2024-07-21: qty 2

## 2024-07-21 MED ORDER — LACTATED RINGERS IV SOLN: INTRAVENOUS | Status: DC

## 2024-07-21 MED ORDER — OXYCODONE HCL 5 MG PO TABS: 5.0000 mg | ORAL_TABLET | ORAL | Status: DC | PRN

## 2024-07-21 MED ORDER — MIDAZOLAM HCL 2 MG/2ML IJ SOLN
INTRAMUSCULAR | Status: AC
  Filled 2024-07-21: qty 2

## 2024-07-21 MED ORDER — FENTANYL CITRATE (PF) 100 MCG/2ML IJ SOLN
INTRAMUSCULAR | Status: DC | PRN
  Administered 2024-07-21 (×2): 25 ug via INTRAVENOUS
  Administered 2024-07-21: 50 ug via INTRAVENOUS

## 2024-07-21 MED ORDER — BUPIVACAINE-EPINEPHRINE 0.25% -1:200000 IJ SOLN
INTRAMUSCULAR | Status: DC | PRN
  Administered 2024-07-21: 20 mL

## 2024-07-21 MED ORDER — PROPOFOL 10 MG/ML IV BOLUS
INTRAVENOUS | Status: DC | PRN
  Administered 2024-07-21: 10 mg via INTRAVENOUS

## 2024-07-21 MED ORDER — ACETAMINOPHEN 325 MG PO TABS: 650.0000 mg | ORAL_TABLET | ORAL | Status: DC | PRN

## 2024-07-21 MED ORDER — MIDAZOLAM HCL 5 MG/5ML IJ SOLN
INTRAMUSCULAR | Status: DC | PRN
  Administered 2024-07-21: 2 mg via INTRAVENOUS

## 2024-07-21 MED ORDER — TRAMADOL HCL 50 MG PO TABS: 50.0000 mg | ORAL_TABLET | Freq: Four times a day (QID) | ORAL | 0 refills | Status: AC | PRN

## 2024-07-21 MED ORDER — CHLORHEXIDINE GLUCONATE 0.12 % MT SOLN
15.0000 mL | Freq: Once | OROMUCOSAL | Status: AC
  Administered 2024-07-21: 15 mL via OROMUCOSAL

## 2024-07-21 MED ORDER — CEFAZOLIN SODIUM-DEXTROSE 2-4 GM/100ML-% IV SOLN
2.0000 g | INTRAVENOUS | Status: AC
  Administered 2024-07-21: 2 g via INTRAVENOUS
  Filled 2024-07-21: qty 100

## 2024-07-21 MED ORDER — ACETAMINOPHEN 650 MG RE SUPP: 650.0000 mg | RECTAL | Status: DC | PRN

## 2024-07-21 SURGICAL SUPPLY — 24 items
BAG COUNTER SPONGE SURGICOUNT (BAG) IMPLANT
BENZOIN TINCTURE PRP APPL 2/3 (GAUZE/BANDAGES/DRESSINGS) IMPLANT
CLSR STERI-STRIP ANTIMIC 1/2X4 (GAUZE/BANDAGES/DRESSINGS) IMPLANT
COVER SURGICAL LIGHT HANDLE (MISCELLANEOUS) ×1 IMPLANT
DERMABOND ADVANCED .7 DNX12 (GAUZE/BANDAGES/DRESSINGS) IMPLANT
DRAPE LAPAROSCOPIC ABDOMINAL (DRAPES) IMPLANT
DRAPE LAPAROTOMY TRNSV 102X78 (DRAPES) IMPLANT
DRAPE UTILITY XL STRL (DRAPES) ×1 IMPLANT
ELECT REM PT RETURN 15FT ADLT (MISCELLANEOUS) ×1 IMPLANT
GAUZE SPONGE 4X4 12PLY STRL (GAUZE/BANDAGES/DRESSINGS) ×1 IMPLANT
GLOVE BIO SURGEON STRL SZ 6 (GLOVE) ×1 IMPLANT
GLOVE INDICATOR 6.5 STRL GRN (GLOVE) ×1 IMPLANT
GOWN STRL REUS W/ TWL LRG LVL3 (GOWN DISPOSABLE) ×1 IMPLANT
KIT BASIN OR (CUSTOM PROCEDURE TRAY) ×1 IMPLANT
KIT TURNOVER KIT A (KITS) ×1 IMPLANT
MARKER SKIN DUAL TIP RULER LAB (MISCELLANEOUS) IMPLANT
NDL HYPO 22X1.5 SAFETY MO (MISCELLANEOUS) ×1 IMPLANT
NEEDLE HYPO 22X1.5 SAFETY MO (MISCELLANEOUS) ×1 IMPLANT
PACK GENERAL/GYN (CUSTOM PROCEDURE TRAY) ×1 IMPLANT
SPIKE FLUID TRANSFER (MISCELLANEOUS) IMPLANT
SUT MNCRL AB 4-0 PS2 18 (SUTURE) ×1 IMPLANT
SUT VIC AB 3-0 SH 27XBRD (SUTURE) ×1 IMPLANT
SYR CONTROL 10ML LL (SYRINGE) ×1 IMPLANT
TOWEL OR 17X26 10 PK STRL BLUE (TOWEL DISPOSABLE) ×1 IMPLANT

## 2024-07-21 NOTE — Anesthesia Preprocedure Evaluation (Addendum)
 Anesthesia Evaluation  Patient identified by MRN, date of birth, ID band Patient awake    Reviewed: Allergy & Precautions, H&P , NPO status , Patient's Chart, lab work & pertinent test results  Airway Mallampati: II  TM Distance: >3 FB Neck ROM: Full    Dental no notable dental hx. (+) Teeth Intact, Dental Advisory Given   Pulmonary neg pulmonary ROS   Pulmonary exam normal breath sounds clear to auscultation       Cardiovascular + Valvular Problems/Murmurs MVP  Rhythm:Regular Rate:Normal     Neuro/Psych negative neurological ROS  negative psych ROS   GI/Hepatic Neg liver ROS,GERD  Medicated,,  Endo/Other  negative endocrine ROS    Renal/GU negative Renal ROS  negative genitourinary   Musculoskeletal   Abdominal   Peds  Hematology negative hematology ROS (+)   Anesthesia Other Findings   Reproductive/Obstetrics negative OB ROS                              Anesthesia Physical Anesthesia Plan  ASA: 2  Anesthesia Plan: MAC   Post-op Pain Management: Tylenol  PO (pre-op)*   Induction: Intravenous  PONV Risk Score and Plan: 2 and Midazolam  and Ondansetron  Airway Management Planned: Natural Airway and Simple Face Mask  Additional Equipment:   Intra-op Plan:   Post-operative Plan:   Informed Consent: I have reviewed the patients History and Physical, chart, labs and discussed the procedure including the risks, benefits and alternatives for the proposed anesthesia with the patient or authorized representative who has indicated his/her understanding and acceptance.     Dental advisory given  Plan Discussed with: CRNA  Anesthesia Plan Comments:          Anesthesia Quick Evaluation

## 2024-07-21 NOTE — Anesthesia Postprocedure Evaluation (Signed)
 Anesthesia Post Note  Patient: Linda Orr  Procedure(s) Performed: EXCISION, LESION, BACK     Patient location during evaluation: PACU Anesthesia Type: MAC Level of consciousness: awake and alert Pain management: pain level controlled Vital Signs Assessment: post-procedure vital signs reviewed and stable Respiratory status: spontaneous breathing, nonlabored ventilation and respiratory function stable Cardiovascular status: stable and blood pressure returned to baseline Postop Assessment: no apparent nausea or vomiting Anesthetic complications: no   No notable events documented.  Last Vitals:  Vitals:   07/21/24 1126 07/21/24 1130  BP:  93/64  Pulse: 64 63  Resp: 11 11  Temp: 36.4 C 36.4 C  SpO2: 96% 96%    Last Pain:  Vitals:   07/21/24 1130  TempSrc:   PainSc: 0-No pain                 Marquize Seib,W. EDMOND

## 2024-07-21 NOTE — Transfer of Care (Signed)
 Immediate Anesthesia Transfer of Care Note  Patient: Rock Staggers  Procedure(s) Performed: EXCISION, LESION, BACK  Patient Location: PACU  Anesthesia Type:MAC  Level of Consciousness: awake, alert , and oriented  Airway & Oxygen Therapy: Patient Spontanous Breathing and Patient connected to face mask oxygen  Post-op Assessment: Report given to RN and Post -op Vital signs reviewed and stable  Post vital signs: Reviewed and stable  Last Vitals:  Vitals Value Taken Time  BP    Temp    Pulse    Resp 15 07/21/24 10:53  SpO2    Vitals shown include unfiled device data.  Last Pain:  Vitals:   07/21/24 0804  TempSrc:   PainSc: 0-No pain         Complications: No notable events documented.

## 2024-07-21 NOTE — Op Note (Signed)
 Operative Note  Linda Orr  [id number]  [id number]  07/21/2024   Surgeon: Mitzie Freund MD FACS   Procedure performed: Excision of subcutaneous mass, right upper back, 7 x 3 x 2 cm   Preop diagnosis: Subcutaneous mass Post-op diagnosis/intraop findings: Same   Specimens: Right upper back subcutaneous mass Retained items: No EBL: Minimal cc Complications: none   Description of procedure: After confirming informed consent the patient was taken to the operating room and placed prone on the operating room table where MAC was initiated, preoperative antibiotics were administered, SCDs applied, and a formal timeout was performed.  The upper back was prepped and draped in the usual sterile fashion and the palpable lesion outlined.  A field block was then performed with quarter percent Marcaine  with epinephrine .  A slightly oblique incision was made following the Langer's lines and the soft tissue dissected with cautery until the surface of the mass was encountered.  Combination of blunt and cautery dissection was then used to free the mass of adhesions throughout the surrounding soft tissue.  The mass did extend down to but did not penetrate the fascia.  Ultimately the mass was excised intact and handed off for pathology with the dimensions as described above.  Hemostasis was ensured in the field with cautery.  The deep soft tissues were reapproximated with 3-0 Vicryl and the skin was closed with interrupted deep dermal 3-0 Vicryl's followed by running subcuticular 4 Monocryl.  Benzoin, Steri-Strips, and a light pressure dressing of gauze and tape were then applied.  The patient was then returned to the supine position, awakened and taken to PACU in stable condition.    All counts were correct at the completion of the case.

## 2024-07-21 NOTE — Discharge Instructions (Signed)
 GENERAL SURGERY: POST OP INSTRUCTIONS  EAT Gradually transition to your usual diet over the next few days after discharge.  WALK Walk an hour a day (cumulative, not all at once).  Control your pain to do that.    CONTROL PAIN Control pain so that you can walk, sleep, tolerate sneezing/coughing, go up/down stairs.  HAVE A BOWEL MOVEMENT DAILY Keep your bowels regular to avoid problems.  OK to try a laxative to override constipation.  OK to use an antidairrheal to slow down diarrhea.  Call if not better after 2 tries  CALL IF YOU HAVE PROBLEMS/CONCERNS Call if you are still struggling despite following these instructions. Call if you have concerns not answered by these instructions    DIET: Follow a light bland diet & liquids the first 24 hours after arrival home, such as soup, liquids, starches, etc.  Be sure to drink plenty of fluids.  Quickly advance to a usual solid diet within a few days.  Avoid fast food or heavy meals as your are more likely to get nauseated or have irregular bowels.   Take your usually prescribed home medications unless otherwise directed. PAIN CONTROL: Pain is best controlled by a usual combination of three different methods TOGETHER: Ice/Heat Over the counter pain medication Prescription pain medication Most patients will experience some swelling and bruising around the incisions.  Ice packs or heating pads (30-60 minutes up to 6 times a day) will help. Use ice for the first few days to help decrease swelling and bruising, then switch to heat to help relax tight/sore spots and speed recovery.  Some people prefer to use ice alone, heat alone, alternating between ice & heat.  Experiment to what works for you.  Swelling and bruising can take several weeks to resolve.   It is helpful to take an over-the-counter pain medication regularly for the first few weeks.  Choose one of the following that works best for you: Naproxen (Aleve, etc)  Two 220mg  tabs twice a day OR  Ibuprofen (Advil, etc) Three 200mg  tabs four times a day (every meal & bedtime) AND Acetaminophen  (Tylenol , etc) 500-650mg  four times a day (every meal & bedtime) A  prescription for pain medication should be given to you upon discharge.  Take your pain medication as prescribed, IF NEEDED.  If you are having problems/concerns with the prescription medicine (does not control pain, nausea, vomiting, rash, itching, etc), please call us  [phone] to see if we need to switch you to a different pain medicine that will work better for you and/or control your side effect better. If you need a refill on your pain medication, please contact your pharmacy.  They will contact our office to request authorization. Prescriptions will not be filled after 5 pm or on week-ends. Avoid getting constipated.  Between the surgery and the pain medications, it is common to experience some constipation.  Increasing fluid intake and taking a fiber supplement (such as Metamucil, Citrucel, FiberCon, MiraLax, etc) 1-2 times a day regularly will usually help prevent this problem from occurring.  A mild laxative (prune juice, Milk of Magnesia, MiraLax, etc) should be taken according to package directions if there are no bowel movements after 48 hours.   Wash / shower every day, starting 2 days after surgery.  You may shower over the steri strips as they are waterproof.  Continue to shower over incision(s) after the dressing is off. No rubbing, scrubbing, lotions or ointments to incision. Do not soak or submerge incision.  Remove your outer bandage (gauze and tape) 2 days after surgery; steri strips (small white tapes directly on the incision) will peel off after 1-2 weeks.  You may leave the incision open to air. You may replace a dressing/Band-Aid to cover the incision for comfort if you wish.   ACTIVITIES as tolerated:   You may resume regular (light) daily activities beginning the next day--such as daily self-care, walking,  climbing stairs--gradually increasing activities as tolerated.  If you can walk 30 minutes without difficulty, it is safe to try more intense activity such as jogging, treadmill, bicycling, low-impact aerobics, swimming, etc. Save the most intensive and strenuous activity for last such as sit-ups, heavy lifting, contact sports, etc  Refrain from any heavy lifting or straining until you are off narcotics for pain control.   DO NOT PUSH THROUGH PAIN.  Let pain be your guide: If it hurts to do something, don't do it.  Pain is your body warning you to avoid that activity for another week until the pain goes down. You may drive when you are no longer taking prescription pain medication, you can comfortably wear a seatbelt, and you can safely maneuver your car and apply brakes. You may have sexual intercourse when it is comfortable.  FOLLOW UP in our office Please call [facility] at [phone] to set up an appointment to see your surgeon in the office for a follow-up appointment approximately 2-3 weeks after your surgery. Make sure that you call for this appointment the day you arrive home to insure a convenient appointment time. 9. IF YOU HAVE DISABILITY OR FAMILY LEAVE FORMS, BRING THEM TO THE OFFICE FOR PROCESSING.  DO NOT GIVE THEM TO YOUR DOCTOR.   WHEN TO CALL US  [phone]: Poor pain control Reactions / problems with new medications (rash/itching, nausea, etc)  Fever over 101.5 F (38.5 C) Worsening swelling or bruising Continued bleeding from incision. Increased pain, redness, or drainage from the incision Difficulty breathing / swallowing   The clinic staff is available to answer your questions during regular business hours (8:30am-5pm).  Please don't hesitate to call and ask to speak to one of our nurses for clinical concerns.   If you have a medical emergency, go to the nearest emergency room or call 911.  A surgeon from [facility] is always on call at the  [facility], [place] [address], [address], [place], [place]  [address] ? MAIN: [phone] ? TOLL FREE: [phone] ?  FAX [phone] [email or web address]

## 2024-07-21 NOTE — H&P (Signed)
 Linda Orr [id number]   Referring Provider:  Christel Cousins, MD   Subjective   Chief Complaint: New Consultation (BENIGN TUMOR ON BACK)     History of Present Illness:    58 year old woman with history of GERD, prediabetes, mitral valve prolapse who presents for evaluation of a suspected lipoma on her back.  She noted this about 6 months ago stating that her massage therapist found it.  No associated pain per se, but it does occasionally feel tight or irritated especially when she has to sit in a certain position for a long time and when back pain is flaring up; no infection or drainage, but some swelling noted.  She underwent an ultrasound for this in January which noted a 5.6 x 1 x 5 cm ovoid avascular lesion likely representing a benign lipoma.  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  This does entail a lot of travel and she finds that sitting in airplane seats aggravates the discomfort from the lipoma.   Review of Systems: A complete review of systems was obtained from the patient.  I have reviewed this information and discussed as appropriate with the patient.  See HPI as well for other ROS.   Medical History: History reviewed. No pertinent past medical history.  There is no problem list on file for this patient.   Past Surgical History:  Procedure Laterality Date   TONSILLECTOMY  1972     Allergies  Allergen Reactions   Egg Other (See Comments)    Sore Throat and Congestion  Other Reaction(s): Other    Sore Throat and Congestion   Mold Other (See Comments)    Sore throat and Congestion  Other Reaction(s): Other    Sore throat and Congestion   Other Other (See Comments)    TOMATOES    Reaction: Sore Throat and Congestion  TOMATOES  Reaction: Sore Throat and Congestion   Wheat Other (See Comments)    Sore Throat and Congestion  Other Reaction(s): Other    Sore Throat and Congestion    Current Outpatient  Medications on File Prior to Visit  Medication Sig Dispense Refill   estradioL  (ESTRACE ) 0.01 % (0.1 mg/gram) vaginal cream Apply pea-sized amount (first line of applicator) at night once per week     pantoprazole  (PROTONIX ) 40 MG DR tablet Take 1 tablet by mouth once daily     No current facility-administered medications on file prior to visit.    History reviewed. No pertinent family history.   Social History   Tobacco Use  Smoking Status Never  Smokeless Tobacco Never     Social History   Socioeconomic History   Marital status: Married  Tobacco Use   Smoking status: Never   Smokeless tobacco: Never  Vaping Use   Vaping status: Never Used  Substance and Sexual Activity   Alcohol use: Yes   Drug use: Never   Social Drivers of Corporate investment banker Strain: Patient Declined (10/08/2023)   Received from [facility]   Overall Financial Resource Strain (CARDIA)    Difficulty of Paying Living Expenses: Patient declined  Food Insecurity: Patient Declined (10/08/2023)   Received from [facility]   Hunger Vital Sign    Worried About Running Out of Food in the Last Year: Patient declined    Ran Out of Food in the Last Year: Patient declined  Transportation Needs: Patient Declined (10/08/2023)   Received from [facility] - Transportation  Lack of Transportation (Medical): Patient declined    Lack of Transportation (Non-Medical): Patient declined  Physical Activity: Insufficiently Active (10/08/2023)   Received from [facility]   Exercise Vital Sign    Days of Exercise per Week: 3 days    Minutes of Exercise per Session: 40 min  Stress: Stress Concern Present (10/08/2023)   Received from [facility] - Occupational Stress Questionnaire    Feeling of Stress : Rather much  Social Connections: Unknown (10/08/2023)   Received from [facility]   Social Connection and Isolation Panel [NHANES]    Frequency of Communication  with Friends and Family: More than three times a week    Frequency of Social Gatherings with Friends and Family: Patient declined    Attends Religious Services: Patient declined    Active Member of Clubs or Organizations: Patient declined    Marital Status: Married  Housing Stability: Unknown (04/16/2024)   Housing Stability Vital Sign    Homeless in the Last Year: No    Objective:    Vitals:   04/16/24 1540  BP: 100/68  Pulse: 82  Temp: 36.6 C (97.9 F)  SpO2: 99%  Weight: 84.4 kg (186 lb)  Height: 175.3 cm (5\' 9" )  PainSc: 0-No pain  PainLoc: Back    Body mass index is 27.47 kg/m.  Gen: A&Ox3, no distress  Chest: respiratory effort is normal. Neuro: no gross deficit Psych: appropriate mood and affect, normal insight/judgment intact  Skin: warm and dry.  Smooth, soft, mobile ovoid subcutaneous mass which is fairly well-circumscribed just medial to the right shoulder blade.  No overlying skin changes or tenderness.  Assessment and Plan:  Diagnoses and all orders for this visit:  Subcutaneous mass  Lipoma.  Desires excision.  We reviewed the procedure and risks including bleeding, infection, pain, scarring, wound healing problems or undesired cosmetic result, seroma/hematoma, and recurrence of the lesion.  Also discussed the option of ongoing monitoring and signs symptoms that should prompt her to return for further evaluation.  Questions welcomed and answered.  She would like to discuss scheduling options as well as cost with our schedulers but thinks she will likely proceed with surgery when she has further information.    Aldon Hung MD FACS

## 2024-07-22 ENCOUNTER — Encounter (HOSPITAL_COMMUNITY): Payer: Self-pay | Admitting: Surgery

## 2024-07-23 LAB — SURGICAL PATHOLOGY

## 2024-09-12 ENCOUNTER — Emergency Department (HOSPITAL_BASED_OUTPATIENT_CLINIC_OR_DEPARTMENT_OTHER)

## 2024-09-12 ENCOUNTER — Emergency Department (HOSPITAL_BASED_OUTPATIENT_CLINIC_OR_DEPARTMENT_OTHER)
Admission: EM | Admit: 2024-09-12 | Discharge: 2024-09-12 | Disposition: A | Discharge status: Home / Self Care | Attending: Emergency Medicine | Admitting: Emergency Medicine

## 2024-09-12 ENCOUNTER — Encounter (HOSPITAL_BASED_OUTPATIENT_CLINIC_OR_DEPARTMENT_OTHER): Payer: Self-pay

## 2024-09-12 ENCOUNTER — Other Ambulatory Visit: Payer: Self-pay

## 2024-09-12 DIAGNOSIS — D72829 Elevated white blood cell count, unspecified: Secondary | ICD-10-CM | POA: Diagnosis not present

## 2024-09-12 DIAGNOSIS — K5732 Diverticulitis of large intestine without perforation or abscess without bleeding: Secondary | ICD-10-CM | POA: Insufficient documentation

## 2024-09-12 DIAGNOSIS — R1032 Left lower quadrant pain: Secondary | ICD-10-CM | POA: Diagnosis present

## 2024-09-12 DIAGNOSIS — K5792 Diverticulitis of intestine, part unspecified, without perforation or abscess without bleeding: Secondary | ICD-10-CM

## 2024-09-12 LAB — COMPREHENSIVE METABOLIC PANEL WITH GFR
ALT: 14 U/L (ref 0–44)
AST: 16 U/L (ref 15–41)
Albumin: 4.2 g/dL (ref 3.5–5.0)
Alkaline Phosphatase: 47 U/L (ref 38–126)
Anion gap: 13 (ref 5–15)
BUN: 10 mg/dL (ref 6–20)
CO2: 24 mmol/L (ref 22–32)
Calcium: 9.2 mg/dL (ref 8.9–10.3)
Chloride: 100 mmol/L (ref 98–111)
Creatinine, Ser: 0.76 mg/dL (ref 0.44–1.00)
GFR, Estimated: >60 mL/min (ref >60)
Glucose, Bld: 123 mg/dL — ABNORMAL HIGH (ref 70–99)
Potassium: 4 mmol/L (ref 3.5–5.1)
Sodium: 137 mmol/L (ref 135–145)
Total Bilirubin: 0.9 mg/dL (ref 0.0–1.2)
Total Protein: 7.1 g/dL (ref 6.5–8.1)

## 2024-09-12 LAB — CBC
HCT: 41.8 % (ref 36.0–46.0)
Hemoglobin: 13.8 g/dL (ref 12.0–15.0)
MCH: 26.7 pg (ref 26.0–34.0)
MCHC: 33 g/dL (ref 30.0–36.0)
MCV: 80.9 fL (ref 80.0–100.0)
Platelets: 339 K/uL (ref 150–400)
RBC: 5.17 MIL/uL — ABNORMAL HIGH (ref 3.87–5.11)
RDW: 13.2 % (ref 11.5–15.5)
WBC: 18 K/uL — ABNORMAL HIGH (ref 4.0–10.5)
nRBC: 0 % (ref 0.0–0.2)

## 2024-09-12 LAB — LIPASE, BLOOD: Lipase: 23 U/L (ref 11–51)

## 2024-09-12 MED ORDER — ONDANSETRON HCL 4 MG/2ML IJ SOLN
4.0000 mg | Freq: Once | INTRAMUSCULAR | Status: AC
  Administered 2024-09-12: 4 mg via INTRAVENOUS
  Filled 2024-09-12: qty 2

## 2024-09-12 MED ORDER — ONDANSETRON 4 MG PO TBDP: ORAL_TABLET | ORAL | 0 refills | Status: AC

## 2024-09-12 MED ORDER — MORPHINE SULFATE 15 MG PO TABS: 7.5000 mg | ORAL_TABLET | ORAL | 0 refills | Status: AC | PRN

## 2024-09-12 MED ORDER — SODIUM CHLORIDE 0.9 % IV BOLUS
1000.0000 mL | Freq: Once | INTRAVENOUS | Status: AC
  Administered 2024-09-12: 1000 mL via INTRAVENOUS

## 2024-09-12 MED ORDER — AMOXICILLIN-POT CLAVULANATE 875-125 MG PO TABS: 1.0000 | ORAL_TABLET | Freq: Two times a day (BID) | ORAL | 0 refills | Status: DC

## 2024-09-12 MED ORDER — MORPHINE SULFATE (PF) 4 MG/ML IV SOLN
4.0000 mg | Freq: Once | INTRAVENOUS | Status: AC
  Administered 2024-09-12: 4 mg via INTRAVENOUS
  Filled 2024-09-12: qty 1

## 2024-09-12 NOTE — ED Notes (Signed)
 Patient transported to CT

## 2024-09-12 NOTE — ED Provider Notes (Signed)
 [place] EMERGENCY DEPARTMENT AT [facility] Provider Note   CSN: [id number] Arrival date & time: 09/12/24  1655     Patient presents with: Abdominal Pain   Linda Orr is a 58 y.o. female.   58 yo F with a chief complaint of abdominal pain.  Going on for a couple days.  Worse to the left lower quadrant had a fever as high as 102 at home.  She went to urgent care and they obtained a UA that was concerning for hematuria.  She was then sent here for evaluation.  Pain seems to come and go.  Had a bowel movement with some improvement of her symptoms.  Denies diarrhea.   Abdominal Pain      Prior to Admission medications   Medication Sig Start Date End Date Taking? Authorizing Provider  amoxicillin-clavulanate (AUGMENTIN) 875-125 MG tablet Take 1 tablet by mouth every 12 (twelve) hours. 09/12/24  Yes Emil Share, DO  morphine (MSIR) 15 MG tablet Take 0.5 tablets (7.5 mg total) by mouth every 4 (four) hours as needed for severe pain (pain score 7-10). 09/12/24  Yes Duel Conrad, DO  ondansetron (ZOFRAN-ODT) 4 MG disintegrating tablet 4mg  ODT q4 hours prn nausea/vomit 09/12/24  Yes Emil Share, DO  estradiol  (ESTRACE  VAGINAL) 0.1 MG/GM vaginal cream Apply pea-sized amount (first line of applicator) at night once per week Patient not taking: Reported on 06/15/2024 03/04/24   Ozan, Jennifer, DO  pantoprazole  (PROTONIX ) 40 MG tablet Take 1 tablet by mouth daily. Patient taking differently: Take 1 tablet by mouth as needed.    [provider]  Probiotic Product (PROBIOTIC PO) Take by mouth as needed.    [provider]    Allergies: Egg shells; Egg solids, whole; Molds & smuts; Other; and Wheat    Review of Systems  Gastrointestinal:  Positive for abdominal pain.    Updated Vital Signs BP 115/89 (BP Location: Right Arm)   Pulse 99   Temp 98.7 F (37.1 C) (Oral)   Resp 18   Ht 5' 9 (1.753 m)   Wt 81.6 kg   SpO2 98%   BMI 26.58 kg/m   Physical  Exam Vitals and nursing note reviewed.  Constitutional:      General: She is not in acute distress.    Appearance: She is well-developed. She is not diaphoretic.  HENT:     Head: Normocephalic and atraumatic.  Eyes:     Pupils: Pupils are equal, round, and reactive to light.  Cardiovascular:     Rate and Rhythm: Normal rate and regular rhythm.     Heart sounds: No murmur heard.    No friction rub. No gallop.  Pulmonary:     Effort: Pulmonary effort is normal.     Breath sounds: No wheezing or rales.  Abdominal:     General: There is no distension.     Palpations: Abdomen is soft.     Tenderness: There is abdominal tenderness.     Comments: Diffuse tenderness with guarding.  Points to left lower quadrant as area of most discomfort though objectively seems to have more discomfort to the suprapubic region.  Musculoskeletal:        General: No tenderness.     Cervical back: Normal range of motion and neck supple.  Skin:    General: Skin is warm and dry.  Neurological:     Mental Status: She is alert and oriented to person, place, and time.  Psychiatric:  Behavior: Behavior normal.     (all labs ordered are listed, but only abnormal results are displayed) Labs Reviewed  COMPREHENSIVE METABOLIC PANEL WITH GFR - Abnormal; Notable for the following components:      Result Value   Glucose, Bld 123 (*)    All other components within normal limits  CBC - Abnormal; Notable for the following components:   WBC 18.0 (*)    RBC 5.17 (*)    All other components within normal limits  LIPASE, BLOOD  URINALYSIS, ROUTINE W REFLEX MICROSCOPIC    EKG: None  Radiology: CT Renal Stone Study Result Date: 09/12/2024 CLINICAL DATA:  Abdominal/flank pain, stone suspected. EXAM: CT ABDOMEN AND PELVIS WITHOUT CONTRAST TECHNIQUE: Multidetector CT imaging of the abdomen and pelvis was performed following the standard protocol without IV contrast. RADIATION DOSE REDUCTION: This exam was  performed according to the departmental dose-optimization program which includes automated exposure control, adjustment of the mA and/or kV according to patient size and/or use of iterative reconstruction technique. COMPARISON:  None Available. FINDINGS: Lower chest: No acute abnormality. Hepatobiliary: No focal liver abnormality is seen. No gallstones, gallbladder wall thickening, or biliary dilatation. Pancreas: Unremarkable. No pancreatic ductal dilatation or surrounding inflammatory changes. Spleen: Normal in size without focal abnormality. Adrenals/Urinary Tract: There is a 9 mm nodule in the left adrenal gland with attenuation of 1 Hounsfield unit, compatible with adenoma. The right adrenal gland is within normal limits. No renal or ureteral calculus bilaterally. The bladder is unremarkable. Stomach/Bowel: There is a small hiatal hernia. The stomach is otherwise within normal limits. No bowel obstruction, free air, or pneumatosis. Scattered diverticula are present along the colon with bowel wall thickening and surrounding inflammatory changes involving the sigmoid colon. No abscess is seen. Appendix appears normal. Vascular/Lymphatic: No significant vascular findings are present. There is hazy attenuation with prominent lymph nodes measuring up to 1.1 cm at the mesenteric root, suggesting panniculitis. Reproductive: Uterus and bilateral adnexa are unremarkable. Other: A small amount of free fluid is noted in the pelvis and left adnexal region. Musculoskeletal: Mild degenerative changes in the lumbar spine. No acute osseous abnormality. IMPRESSION: 1. Sigmoid diverticulitis.  No free air or abscess is seen. 2. No renal calculus or obstructive uropathy bilaterally. 3. 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 4. Small amount of free fluid in the pelvis. 5. Small hiatal hernia. Electronically Signed   By: Leita Birmingham M.D.   On: 09/12/2024 18:17     Procedures    Medications Ordered in the ED  morphine (PF) 4 MG/ML injection 4 mg (4 mg Intravenous Given 09/12/24 1743)  ondansetron (ZOFRAN) injection 4 mg (4 mg Intravenous Given 09/12/24 1743)  sodium chloride  0.9 % bolus 1,000 mL (1,000 mLs Intravenous New Bag/Given 09/12/24 1743)                                    Medical Decision Making Amount and/or Complexity of Data Reviewed Labs: ordered. Radiology: ordered.  Risk Prescription drug management.   58 yo F with a chief complaints of lower abdominal pain going on for a couple days.  Having fevers as high as 102 reportedly at home.  She had a urine done at urgent care and there was hematuria but no obvious signs of infection on my review of the UA.  Seems less likely for kidney stone to cause fever.  Will obtain CT imaging.  Blood  work.  Treat symptoms reassess.  CT imaging with diverticulitis.  No ureterolithiasis.  Mild leukocytosis no anemia no significant electrolyte abnormalities.  Patient is feeling a bit better after pain medicine IV fluids.  I discussed results with patient and family.  Will treat with antibiotics and have her follow-up with her PCP and GI doctor.  6:50 PM:  I have discussed the diagnosis/risks/treatment options with the patient and family.  Evaluation and diagnostic testing in the emergency department does not suggest an emergent condition requiring admission or immediate intervention beyond what has been performed at this time.  They will follow up with PCP, GI. We also discussed returning to the ED immediately if new or worsening sx occur. We discussed the sx which are most concerning (e.g., sudden worsening pain, fever, inability to tolerate by mouth) that necessitate immediate return. Medications administered to the patient during their visit and any new prescriptions provided to the patient are listed below.  Medications given during this visit Medications  morphine (PF) 4 MG/ML injection 4 mg (4 mg  Intravenous Given 09/12/24 1743)  ondansetron (ZOFRAN) injection 4 mg (4 mg Intravenous Given 09/12/24 1743)  sodium chloride  0.9 % bolus 1,000 mL (1,000 mLs Intravenous New Bag/Given 09/12/24 1743)     The patient appears reasonably screen and/or stabilized for discharge and I doubt any other medical condition or other [facility] requiring further screening, evaluation, or treatment in the ED at this time prior to discharge.       Final diagnoses:  Diverticulitis    ED Discharge Orders          Ordered    amoxicillin-clavulanate (AUGMENTIN) 875-125 MG tablet  Every 12 hours        09/12/24 1847    morphine (MSIR) 15 MG tablet  Every 4 hours PRN        09/12/24 1847    ondansetron (ZOFRAN-ODT) 4 MG disintegrating tablet        09/12/24 1847               Emil Share, DO 09/12/24 1850

## 2024-09-12 NOTE — Discharge Instructions (Signed)
 Please call your family doctor tomorrow and let them know about your visit here.  Take the antibiotics as prescribed.  Please return for worsening pain inability to eat or drink.  As we discussed the gastroenterology providers usually wanted know if this happens to you.  They can decide if any further workup needs to be done.  you had blood in your urine at urgent care.  Please discuss this with your family doctor.  They may want to recheck it once you are not sick.

## 2024-09-12 NOTE — ED Triage Notes (Signed)
 Pt reports lower abd pain x3 days. Pt reports fever, nausea. Pt went to UC for same and had urine tested. Positive for hematuria.

## 2024-09-14 ENCOUNTER — Telehealth: Payer: Self-pay | Admitting: Internal Medicine

## 2024-09-14 NOTE — Telephone Encounter (Signed)
 Attempted to reach patient. Reached VM. Left message to return call.

## 2024-09-14 NOTE — Telephone Encounter (Signed)
 Inbound call from patient stating that she was in the ER over the weekend for Diverticulitis. They advised her to call our office to speak with Dr. Nancyann nurse regarding her visit. Requesting a call back to discuss. Please advise.

## 2024-09-15 NOTE — Telephone Encounter (Signed)
 Patient states that she actually spoke with someone from our office yesterday(?) and does not have any additional concerns at this time. States she feels she is improving on antibiotics recently given for diverticulitis. Patient is advised that should her abdominal pain return, begin to worsen, she develop a fever, diarrhea or blood in the stool, she should reach back out. She verbalizes understanding.

## 2024-09-16 ENCOUNTER — Telehealth: Payer: Self-pay

## 2024-09-16 NOTE — Telephone Encounter (Signed)
 Copied from CRM [id number]. Topic: General - Other >> Sep 16, 2024  9:03 AM Vena HERO wrote: Reason for CRM: Pt seen in ER on Sunday and would like dr alpha nurse to call her back so she can follow up, [phone]  Spoke with patient; pt states she feels menstrual cramp times 8 in lower abdomen; started some vaginal cream but was given morphine taking only at night but overnight pain got worse. Was diagnosed with diverticulitis but don't think that it was It is. Need follow up appt. Transferred to schedule.

## 2024-09-17 ENCOUNTER — Encounter: Payer: Self-pay | Admitting: Family Medicine

## 2024-09-17 ENCOUNTER — Ambulatory Visit: Admitting: Family Medicine

## 2024-09-17 VITALS — BP 124/70 | HR 75 | Temp 98.2°F | Ht 69.0 in | Wt 183.0 lb

## 2024-09-17 DIAGNOSIS — R3129 Other microscopic hematuria: Secondary | ICD-10-CM | POA: Diagnosis not present

## 2024-09-17 DIAGNOSIS — K5732 Diverticulitis of large intestine without perforation or abscess without bleeding: Secondary | ICD-10-CM | POA: Diagnosis not present

## 2024-09-17 NOTE — Progress Notes (Unsigned)
 Subjective:     Patient ID: Linda Orr, female    DOB: 04-Jan-1966, 58 y.o.   MRN: [id number]  Chief Complaint  Patient presents with   Hospitalization Follow-up    Urine culture was negative from ED; still not really able to eat much; last time passed stool was Wed night; still have constant pain on lower left side    Discussed the use of AI scribe software for clinical note transcription with the patient, who gave verbal consent to proceed.  History of Present Illness Linda Orr is a 58 year old female who presents with abdominal pain and recent diagnosis of diverticulitis.  She has been experiencing constant abdominal pain primarily located in the left lower quadrant, with a baseline intensity of 3 out of 10, escalating to severe jabbing pains rated at 10 out of 10 the previous day. The pain worsens with movement, such as walking or stretching. She reports that the emergency room performed a CT scan and told her she had diverticulitis, for which she was prescribed amoxicillin-clavulanic acid (Augmentin).  She experienced chills yesterday but no fever recurrence. She has been closely monitoring her temperature. She has not had a bowel movement since Tuesday night or Wednesday night, although she has been passing gas. Her diet has been limited to small amounts of mashed potatoes, clear broth, and other bland foods.  She is concerned about blood found in her urine during a test at urgent care, although a subsequent urine culture was negative. 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 She has stopped using the cream until further evaluation.  No recent travel to the [place] She has been following a high-fiber diet and engaging in regular physical activity prior to the onset of her symptoms. Her current medications include amoxicillin-clavulanic acid, and she has been taking pain medication  as needed.    Health Maintenance Due  Topic Date Due   Hepatitis B Vaccines 19-59 Average Risk (1 of 3 - 19+ 3-dose series) Never done   Pneumococcal Vaccine: 50+ Years (1 of 1 - PCV) Never done   Influenza Vaccine  07/02/2024   COVID-19 Vaccine (7 - 2025-26 season) 08/02/2024    Past Medical History:  Diagnosis Date   Allergy    Egg, wheat, other food allergies   Complication of anesthesia    heart rate drops and slow to wake up   GERD (gastroesophageal reflux disease)    Heart murmur    MVP   Prediabetes     Past Surgical History:  Procedure Laterality Date   COLONOSCOPY  01/09/2018   EXCISION OF BACK LESION N/A 07/21/2024   Procedure: EXCISION, LESION, BACK;  Surgeon: Signe Mitzie LABOR, MD;  Location: [facility];  Service: General;  Laterality: N/A;   LEEP  2014   TONSILLECTOMY       Current Outpatient Medications:    amoxicillin-clavulanate (AUGMENTIN) 875-125 MG tablet, Take 1 tablet by mouth every 12 (twelve) hours., Disp: 14 tablet, Rfl: 0   morphine (MSIR) 15 MG tablet, Take 0.5 tablets (7.5 mg total) by mouth every 4 (four) hours as needed for severe pain (pain score 7-10)., Disp: 7 tablet, Rfl: 0   ondansetron (ZOFRAN-ODT) 4 MG disintegrating tablet, 4mg  ODT q4 hours prn nausea/vomit, Disp: 20 tablet, Rfl: 0   pantoprazole  (PROTONIX ) 40 MG tablet, Take 1 tablet by mouth daily. (Patient taking differently: Take 1 tablet  by mouth as needed.), Disp: , Rfl:    Probiotic Product (PROBIOTIC PO), Take by mouth as needed., Disp: , Rfl:    estradiol  (ESTRACE  VAGINAL) 0.1 MG/GM vaginal cream, Apply pea-sized amount (first line of applicator) at night once per week (Patient not taking: Reported on 09/17/2024), Disp: 42.5 g, Rfl: 3  Allergies  Allergen Reactions   Egg Shells     Other Reaction(s): Other  Sore Throat and Congestion   Egg Solids, Whole Other (See Comments)    Sore Throat and Congestion    Other Reaction(s): Other  Sore Throat and Congestion   Molds & Smuts  Other (See Comments)    Other Reaction(s): Other  Sore throat and Congestion  Sore throat and Congestion    Other Reaction(s): Other  Sore throat and Congestion   Other Other (See Comments)    TOMATOES    Reaction: Sore Throat and Congestion  TOMATOES  Reaction: Sore Throat and Congestion    TOMATOES  Reaction: Sore Throat and Congestion   Wheat Other (See Comments)    Other Reaction(s): Other  Sore Throat and Congestion  Sore Throat and Congestion    Other Reaction(s): Other  Sore Throat and Congestion   ROS neg/noncontributory except as noted HPI/below      Objective:     BP 124/70   Pulse 75   Temp 98.2 F (36.8 C)   Ht 5' 9 (1.753 m)   Wt 183 lb (83 kg)   SpO2 97%   BMI 27.02 kg/m  Wt Readings from Last 3 Encounters:  09/17/24 183 lb (83 kg)  09/12/24 180 lb (81.6 kg)  07/21/24 183 lb (83 kg)    Physical Exam GENERAL: Well developed, well nourished, no acute distress. HEAD EYES EARS NOSE THROAT: Normocephalic, atraumatic, conjunctiva not injected, sclera nonicteric. CARDIAC: Regular rate and rhythm, S1 S2 present, no murmur, dorsalis pedis 2 plus bilaterally. NECK: Supple, no thyromegaly, no nodes, no carotid bruits. LUNGS: Clear to auscultation bilaterally, no wheezes. ABDOMEN: Bowel sounds present, soft, tenderness in the mid-abdomen, non-distended, no hepatosplenomegaly, no masses. EXTREMITIES: No edema. MUSCULOSKELETAL: No gross abnormalities. NEUROLOGICAL: Alert and oriented x3, cranial nerves II through XII intact. PSYCHIATRIC: Normal mood, good eye contact.       Assessment & Plan:  There are no diagnoses linked to this encounter.  Assessment and Plan Assessment & Plan Diverticulitis   Diagnosed with diverticulitis via CT scan in the emergency room. She is on amoxicillin-clavulanic acid (Augmentin) and reports fluctuating left lower quadrant pain, ranging from 3 to 10, with tenderness and soreness. No current fever, nausea, or vomiting,  though she previously experienced chills. She has bloating and has not had a bowel movement since Tuesday but is passing gas. Advised to avoid greasy, fatty foods and consume bland foods. Discussed recurrence potential and need for further intervention if symptoms worsen. Severe symptoms like fever, nausea, vomiting, or unrelenting pain could indicate complications such as rupture, requiring emergency care. Recovery may take 1-2 weeks, with severe recurrence possibly necessitating surgery. Continue amoxicillin-clavulanic acid until Sunday. Start Miralax once or twice daily to prevent constipation. Avoid greasy, fatty, and fried foods; eat bland foods. Contact the office if symptoms persist or worsen by Monday. Consider extending antibiotics if symptoms persist and repeating CT scan if no improvement in two weeks.  Hematuria   Blood in urine was detected at urgent care, but the urine culture was negative, indicating no urinary tract infection. Potential causes include kidney filtration issues or vaginal dryness due  to age. No tumors or stones were found on the CT scan. Plan to repeat the urine test after diverticulitis resolves to rule out persistent hematuria. She should avoid using vaginal cream until further evaluation.  Low-grade squamous intraepithelial lesion (LSIL)   Low-grade squamous intraepithelial lesion (LSIL) is present. These abnormal cells are usually not estrogen dependent. She is advised to consult with a gynecologist regarding any concerns related to LSIL and the use of vaginal cream.  General Health Maintenance   She is following a high-fiber diet and engaging in regular physical activity. Continue high-fiber diet and regular physical activity.     No follow-ups on file.  Jenkins CHRISTELLA Carrel, MD

## 2024-09-19 ENCOUNTER — Encounter: Payer: Self-pay | Admitting: Family Medicine

## 2024-09-19 NOTE — Patient Instructions (Signed)
 Worse, fevers, pain that continues beyond another week, let me know or ER if worse  Check urine 1-2 weeks

## 2024-09-20 ENCOUNTER — Telehealth: Payer: Self-pay

## 2024-09-20 ENCOUNTER — Other Ambulatory Visit: Payer: Self-pay | Admitting: Family Medicine

## 2024-09-20 MED ORDER — AMOXICILLIN-POT CLAVULANATE 875-125 MG PO TABS: 1.0000 | ORAL_TABLET | Freq: Two times a day (BID) | ORAL | 0 refills | Status: AC

## 2024-09-20 NOTE — Telephone Encounter (Signed)
 Copied from CRM [id number]. Topic: Clinical - Medication Question >> Sep 20, 2024 11:04 AM Drema MATSU wrote: Reason for CRM: Patient is requesting a callback from nurse regarding her diverticulitis. She wants pcp to decide if she needs to extend antibiotics or not.

## 2024-09-21 NOTE — Telephone Encounter (Signed)
 Spoke with patient. Pt stated doing better. Will continue on antibiotics for 3 more days. Picked up from pharmacy yesterday.

## 2024-09-27 ENCOUNTER — Telehealth: Payer: Self-pay

## 2024-09-27 ENCOUNTER — Encounter: Payer: Self-pay | Admitting: Family Medicine

## 2024-09-27 NOTE — Telephone Encounter (Signed)
 Copied from CRM [id number]. Topic: General - Call Back - No Documentation >> Sep 27, 2024 11:13 AM Alfonso ORN wrote: Reason for CRM: pt calling back to f/u on pcp discussion on er visit regarding pain in left side. Pt said she still has pain on her left side especially when something goes through her colon.   pt requesting future communications through mychart today as she wont be able to answer the phone.

## 2024-10-06 ENCOUNTER — Other Ambulatory Visit: Payer: Self-pay | Admitting: Family Medicine

## 2024-10-06 DIAGNOSIS — R1030 Lower abdominal pain, unspecified: Secondary | ICD-10-CM

## 2024-10-12 ENCOUNTER — Ambulatory Visit: Payer: Self-pay | Admitting: Family Medicine

## 2024-10-12 ENCOUNTER — Encounter: Payer: Self-pay | Admitting: Radiology

## 2024-10-12 ENCOUNTER — Ambulatory Visit
Admission: RE | Admit: 2024-10-12 | Discharge: 2024-10-12 | Disposition: A | Discharge status: Home / Self Care | Source: Ambulatory Visit | Attending: Family Medicine | Admitting: Family Medicine

## 2024-10-12 DIAGNOSIS — R1030 Lower abdominal pain, unspecified: Secondary | ICD-10-CM

## 2024-10-12 MED ORDER — IOPAMIDOL (ISOVUE-300) INJECTION 61%
100.0000 mL | Freq: Once | INTRAVENOUS | Status: AC | PRN
  Administered 2024-10-12: 100 mL via INTRAVENOUS

## 2024-10-12 NOTE — Progress Notes (Signed)
 CT better-is she still having pain?

## 2025-06-20 ENCOUNTER — Encounter: Admitting: Family Medicine
